# Patient Record
Sex: Female | Born: 1985 | Race: Black or African American | Hispanic: No | Smoking: Current every day smoker
Health system: Southern US, Community
[De-identification: ages and names within clinical notes are randomized; demographics above are authoritative.]

## PROBLEM LIST (undated history)

## (undated) DIAGNOSIS — E049 Nontoxic goiter, unspecified: Secondary | ICD-10-CM

## (undated) DIAGNOSIS — A539 Syphilis, unspecified: Secondary | ICD-10-CM

## (undated) DIAGNOSIS — F329 Major depressive disorder, single episode, unspecified: Secondary | ICD-10-CM

## (undated) DIAGNOSIS — F419 Anxiety disorder, unspecified: Secondary | ICD-10-CM

## (undated) DIAGNOSIS — F319 Bipolar disorder, unspecified: Secondary | ICD-10-CM

## (undated) DIAGNOSIS — R0600 Dyspnea, unspecified: Secondary | ICD-10-CM

## (undated) DIAGNOSIS — Z8742 Personal history of other diseases of the female genital tract: Secondary | ICD-10-CM

## (undated) DIAGNOSIS — R87629 Unspecified abnormal cytological findings in specimens from vagina: Secondary | ICD-10-CM

## (undated) HISTORY — PX: GANGLION CYST EXCISION: SHX1691

## (undated) HISTORY — DX: Unspecified abnormal cytological findings in specimens from vagina: R87.629

## (undated) HISTORY — DX: Personal history of other diseases of the female genital tract: Z87.42

## (undated) HISTORY — DX: Syphilis, unspecified: A53.9

---
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
## 2007-01-08 LAB — OB RESULTS CONSOLE RUBELLA ANTIBODY, IGM: Rubella: IMMUNE

## 2007-01-08 LAB — OB RESULTS CONSOLE VARICELLA ZOSTER ANTIBODY, IGG: Varicella: IMMUNE

## 2013-01-09 ENCOUNTER — Emergency Department: Payer: Self-pay | Admitting: Emergency Medicine

## 2013-07-17 DIAGNOSIS — L680 Hirsutism: Secondary | ICD-10-CM | POA: Insufficient documentation

## 2013-07-17 DIAGNOSIS — N911 Secondary amenorrhea: Secondary | ICD-10-CM | POA: Insufficient documentation

## 2013-07-22 DIAGNOSIS — L720 Epidermal cyst: Secondary | ICD-10-CM | POA: Insufficient documentation

## 2014-04-16 ENCOUNTER — Emergency Department: Payer: Self-pay | Admitting: Internal Medicine

## 2014-05-09 ENCOUNTER — Emergency Department: Payer: Self-pay | Admitting: Emergency Medicine

## 2014-05-09 LAB — CBC
HCT: 45.5 % (ref 35.0–47.0)
HGB: 14.8 g/dL (ref 12.0–16.0)
MCH: 31.1 pg (ref 26.0–34.0)
MCHC: 32.4 g/dL (ref 32.0–36.0)
MCV: 96 fL (ref 80–100)
Platelet: 266 10*3/uL (ref 150–440)
RBC: 4.74 10*6/uL (ref 3.80–5.20)
RDW: 13.2 % (ref 11.5–14.5)
WBC: 6.9 10*3/uL (ref 3.6–11.0)

## 2014-05-09 LAB — COMPREHENSIVE METABOLIC PANEL
ALK PHOS: 82 U/L
ALT: 39 U/L (ref 12–78)
Albumin: 4.5 g/dL (ref 3.4–5.0)
Anion Gap: 4 — ABNORMAL LOW (ref 7–16)
BUN: 5 mg/dL — AB (ref 7–18)
Bilirubin,Total: 0.5 mg/dL (ref 0.2–1.0)
Calcium, Total: 9.6 mg/dL (ref 8.5–10.1)
Chloride: 106 mmol/L (ref 98–107)
Co2: 28 mmol/L (ref 21–32)
Creatinine: 0.98 mg/dL (ref 0.60–1.30)
EGFR (African American): >60
EGFR (Non-African Amer.): >60
Glucose: 105 mg/dL — ABNORMAL HIGH (ref 65–99)
Osmolality: 273 (ref 275–301)
POTASSIUM: 4.1 mmol/L (ref 3.5–5.1)
SGOT(AST): 33 U/L (ref 15–37)
Sodium: 138 mmol/L (ref 136–145)
Total Protein: 8.6 g/dL — ABNORMAL HIGH (ref 6.4–8.2)

## 2014-05-09 LAB — URINALYSIS, COMPLETE
BILIRUBIN, UR: NEGATIVE
Bacteria: NONE SEEN
Blood: NEGATIVE
Glucose,UR: NEGATIVE mg/dL (ref 0–75)
KETONE: NEGATIVE
LEUKOCYTE ESTERASE: NEGATIVE
NITRITE: NEGATIVE
PH: 6 (ref 4.5–8.0)
Protein: NEGATIVE
RBC,UR: <1 /HPF (ref 0–5)
Specific Gravity: 1.008 (ref 1.003–1.030)
Squamous Epithelial: 3
WBC UR: 2 /HPF (ref 0–5)

## 2014-05-09 LAB — SALICYLATE LEVEL: SALICYLATES, SERUM: 3.9 mg/dL — AB

## 2014-05-09 LAB — ETHANOL
Ethanol %: <0.003 % (ref 0.000–0.080)
Ethanol: <3 mg/dL

## 2014-05-09 LAB — DRUG SCREEN, URINE
Amphetamines, Ur Screen: NEGATIVE (ref ?–1000)
Barbiturates, Ur Screen: NEGATIVE (ref ?–200)
Benzodiazepine, Ur Scrn: NEGATIVE (ref ?–200)
Cannabinoid 50 Ng, Ur ~~LOC~~: POSITIVE (ref ?–50)
Cocaine Metabolite,Ur ~~LOC~~: NEGATIVE (ref ?–300)
MDMA (Ecstasy)Ur Screen: NEGATIVE (ref ?–500)
METHADONE, UR SCREEN: NEGATIVE (ref ?–300)
OPIATE, UR SCREEN: NEGATIVE (ref ?–300)
Phencyclidine (PCP) Ur S: NEGATIVE (ref ?–25)
Tricyclic, Ur Screen: NEGATIVE (ref ?–1000)

## 2014-05-09 LAB — ACETAMINOPHEN LEVEL

## 2014-05-14 ENCOUNTER — Emergency Department: Payer: Self-pay | Admitting: Internal Medicine

## 2014-05-14 LAB — URINALYSIS, COMPLETE
BILIRUBIN, UR: NEGATIVE
BLOOD: NEGATIVE
Bacteria: NONE SEEN
Glucose,UR: NEGATIVE mg/dL (ref 0–75)
Ketone: NEGATIVE
NITRITE: NEGATIVE
Ph: 6 (ref 4.5–8.0)
Protein: NEGATIVE
SPECIFIC GRAVITY: 1.01 (ref 1.003–1.030)

## 2014-05-14 LAB — DRUG SCREEN, URINE
Amphetamines, Ur Screen: NEGATIVE (ref ?–1000)
Barbiturates, Ur Screen: NEGATIVE (ref ?–200)
Benzodiazepine, Ur Scrn: NEGATIVE (ref ?–200)
Cannabinoid 50 Ng, Ur ~~LOC~~: POSITIVE (ref ?–50)
Cocaine Metabolite,Ur ~~LOC~~: NEGATIVE (ref ?–300)
MDMA (Ecstasy)Ur Screen: NEGATIVE (ref ?–500)
Methadone, Ur Screen: NEGATIVE (ref ?–300)
OPIATE, UR SCREEN: NEGATIVE (ref ?–300)
PHENCYCLIDINE (PCP) UR S: NEGATIVE (ref ?–25)
TRICYCLIC, UR SCREEN: NEGATIVE (ref ?–1000)

## 2014-05-14 LAB — COMPREHENSIVE METABOLIC PANEL
ALBUMIN: 4.2 g/dL (ref 3.4–5.0)
ALK PHOS: 74 U/L
ANION GAP: 8 (ref 7–16)
BUN: 8 mg/dL (ref 7–18)
Bilirubin,Total: 0.3 mg/dL (ref 0.2–1.0)
CALCIUM: 8.9 mg/dL (ref 8.5–10.1)
CO2: 27 mmol/L (ref 21–32)
Chloride: 102 mmol/L (ref 98–107)
Creatinine: 0.77 mg/dL (ref 0.60–1.30)
EGFR (African American): >60
EGFR (Non-African Amer.): >60
GLUCOSE: 97 mg/dL (ref 65–99)
Osmolality: 272 (ref 275–301)
POTASSIUM: 3.6 mmol/L (ref 3.5–5.1)
SGOT(AST): 29 U/L (ref 15–37)
SGPT (ALT): 34 U/L (ref 12–78)
Sodium: 137 mmol/L (ref 136–145)
TOTAL PROTEIN: 7.8 g/dL (ref 6.4–8.2)

## 2014-05-14 LAB — CBC
HCT: 42.6 % (ref 35.0–47.0)
HGB: 14.5 g/dL (ref 12.0–16.0)
MCH: 32.2 pg (ref 26.0–34.0)
MCHC: 34 g/dL (ref 32.0–36.0)
MCV: 95 fL (ref 80–100)
Platelet: 252 10*3/uL (ref 150–440)
RBC: 4.5 10*6/uL (ref 3.80–5.20)
RDW: 12.8 % (ref 11.5–14.5)
WBC: 6.8 10*3/uL (ref 3.6–11.0)

## 2014-05-14 LAB — ACETAMINOPHEN LEVEL: Acetaminophen: <2 ug/mL

## 2014-05-14 LAB — ETHANOL
Ethanol %: <0.003 % (ref 0.000–0.080)
Ethanol: <3 mg/dL

## 2014-05-14 LAB — SALICYLATE LEVEL: SALICYLATES, SERUM: 3.5 mg/dL — AB

## 2014-05-22 ENCOUNTER — Emergency Department: Payer: Self-pay | Admitting: Emergency Medicine

## 2014-05-22 LAB — CBC
HCT: 42.3 % (ref 35.0–47.0)
HGB: 14.4 g/dL (ref 12.0–16.0)
MCH: 32.3 pg (ref 26.0–34.0)
MCHC: 34.1 g/dL (ref 32.0–36.0)
MCV: 95 fL (ref 80–100)
Platelet: 238 10*3/uL (ref 150–440)
RBC: 4.47 10*6/uL (ref 3.80–5.20)
RDW: 12.9 % (ref 11.5–14.5)
WBC: 10.6 10*3/uL (ref 3.6–11.0)

## 2014-05-23 LAB — ETHANOL: Ethanol: <3 mg/dL

## 2014-05-23 LAB — URINALYSIS, COMPLETE
Bacteria: NONE SEEN
Bilirubin,UR: NEGATIVE
Blood: NEGATIVE
Glucose,UR: NEGATIVE mg/dL (ref 0–75)
Ketone: NEGATIVE
NITRITE: NEGATIVE
Ph: 6 (ref 4.5–8.0)
Protein: 25
RBC,UR: 3 /HPF (ref 0–5)
Specific Gravity: 1.02 (ref 1.003–1.030)
Squamous Epithelial: 11
WBC UR: 24 /HPF (ref 0–5)

## 2014-05-23 LAB — COMPREHENSIVE METABOLIC PANEL
ALBUMIN: 4.1 g/dL (ref 3.4–5.0)
ALK PHOS: 71 U/L
ALT: 34 U/L (ref 12–78)
ANION GAP: 4 — AB (ref 7–16)
BUN: 10 mg/dL (ref 7–18)
Bilirubin,Total: 0.4 mg/dL (ref 0.2–1.0)
CO2: 28 mmol/L (ref 21–32)
CREATININE: 0.86 mg/dL (ref 0.60–1.30)
Calcium, Total: 9.1 mg/dL (ref 8.5–10.1)
Chloride: 104 mmol/L (ref 98–107)
GLUCOSE: 106 mg/dL — AB (ref 65–99)
OSMOLALITY: 271 (ref 275–301)
POTASSIUM: 3.4 mmol/L — AB (ref 3.5–5.1)
SGOT(AST): 20 U/L (ref 15–37)
SODIUM: 136 mmol/L (ref 136–145)
Total Protein: 7.7 g/dL (ref 6.4–8.2)

## 2014-05-23 LAB — DRUG SCREEN, URINE
AMPHETAMINES, UR SCREEN: NEGATIVE (ref ?–1000)
BARBITURATES, UR SCREEN: NEGATIVE (ref ?–200)
Benzodiazepine, Ur Scrn: NEGATIVE (ref ?–200)
CANNABINOID 50 NG, UR ~~LOC~~: POSITIVE (ref ?–50)
Cocaine Metabolite,Ur ~~LOC~~: NEGATIVE (ref ?–300)
MDMA (Ecstasy)Ur Screen: NEGATIVE (ref ?–500)
Methadone, Ur Screen: NEGATIVE (ref ?–300)
Opiate, Ur Screen: NEGATIVE (ref ?–300)
Phencyclidine (PCP) Ur S: NEGATIVE (ref ?–25)
TRICYCLIC, UR SCREEN: NEGATIVE (ref ?–1000)

## 2014-05-23 LAB — SALICYLATE LEVEL: Salicylates, Serum: 3.4 mg/dL — ABNORMAL HIGH

## 2014-05-23 LAB — LITHIUM LEVEL: Lithium: 0.78 mmol/L

## 2014-05-23 LAB — ACETAMINOPHEN LEVEL: Acetaminophen: <2 ug/mL

## 2014-06-12 ENCOUNTER — Emergency Department: Payer: Self-pay | Admitting: Emergency Medicine

## 2014-06-12 LAB — LITHIUM LEVEL: Lithium: 0.56 mmol/L — ABNORMAL LOW

## 2014-06-20 ENCOUNTER — Emergency Department: Payer: Self-pay | Admitting: Emergency Medicine

## 2014-06-24 ENCOUNTER — Emergency Department: Payer: Self-pay | Admitting: Emergency Medicine

## 2014-06-29 LAB — URINALYSIS, COMPLETE
BACTERIA: NONE SEEN
BLOOD: NEGATIVE
Bilirubin,UR: NEGATIVE
Glucose,UR: NEGATIVE mg/dL (ref 0–75)
Ketone: NEGATIVE
Nitrite: NEGATIVE
Ph: 6 (ref 4.5–8.0)
Protein: 25
Specific Gravity: 1.005 (ref 1.003–1.030)
Squamous Epithelial: 8
WBC UR: 12 /HPF (ref 0–5)

## 2014-06-29 LAB — ETHANOL
ETHANOL %: 0.084 % — AB (ref 0.000–0.080)
Ethanol: 84 mg/dL

## 2014-06-29 LAB — CBC
HCT: 40.8 % (ref 35.0–47.0)
HGB: 13.8 g/dL (ref 12.0–16.0)
MCH: 32.3 pg (ref 26.0–34.0)
MCHC: 33.8 g/dL (ref 32.0–36.0)
MCV: 96 fL (ref 80–100)
PLATELETS: 293 10*3/uL (ref 150–440)
RBC: 4.27 10*6/uL (ref 3.80–5.20)
RDW: 12.9 % (ref 11.5–14.5)
WBC: 8.3 10*3/uL (ref 3.6–11.0)

## 2014-06-29 LAB — DRUG SCREEN, URINE
Amphetamines, Ur Screen: NEGATIVE (ref ?–1000)
BARBITURATES, UR SCREEN: NEGATIVE (ref ?–200)
Benzodiazepine, Ur Scrn: NEGATIVE (ref ?–200)
COCAINE METABOLITE, UR ~~LOC~~: POSITIVE (ref ?–300)
Cannabinoid 50 Ng, Ur ~~LOC~~: POSITIVE (ref ?–50)
MDMA (Ecstasy)Ur Screen: NEGATIVE (ref ?–500)
METHADONE, UR SCREEN: NEGATIVE (ref ?–300)
OPIATE, UR SCREEN: NEGATIVE (ref ?–300)
PHENCYCLIDINE (PCP) UR S: NEGATIVE (ref ?–25)
TRICYCLIC, UR SCREEN: NEGATIVE (ref ?–1000)

## 2014-06-29 LAB — COMPREHENSIVE METABOLIC PANEL
ALK PHOS: 74 U/L
Albumin: 4 g/dL (ref 3.4–5.0)
Anion Gap: 4 — ABNORMAL LOW (ref 7–16)
BUN: 3 mg/dL — AB (ref 7–18)
Bilirubin,Total: 0.2 mg/dL (ref 0.2–1.0)
CHLORIDE: 108 mmol/L — AB (ref 98–107)
CO2: 26 mmol/L (ref 21–32)
CREATININE: 0.78 mg/dL (ref 0.60–1.30)
Calcium, Total: 8.8 mg/dL (ref 8.5–10.1)
EGFR (African American): >60
EGFR (Non-African Amer.): >60
Glucose: 93 mg/dL (ref 65–99)
OSMOLALITY: 272 (ref 275–301)
Potassium: 3.6 mmol/L (ref 3.5–5.1)
SGOT(AST): 31 U/L (ref 15–37)
SGPT (ALT): 28 U/L
Sodium: 138 mmol/L (ref 136–145)
Total Protein: 8 g/dL (ref 6.4–8.2)

## 2014-06-29 LAB — SALICYLATE LEVEL: SALICYLATES, SERUM: 3.9 mg/dL — AB

## 2014-06-29 LAB — ACETAMINOPHEN LEVEL: Acetaminophen: <2 ug/mL

## 2014-06-30 LAB — PREGNANCY, URINE: Pregnancy Test, Urine: NEGATIVE m[IU]/mL

## 2014-07-01 ENCOUNTER — Inpatient Hospital Stay: Payer: Self-pay | Admitting: Psychiatry

## 2014-07-03 LAB — HEMOGLOBIN A1C: Hemoglobin A1C: 5 % (ref 4.2–6.3)

## 2014-07-03 LAB — TSH: Thyroid Stimulating Horm: 0.881 u[IU]/mL

## 2014-07-03 LAB — LIPID PANEL
CHOLESTEROL: 155 mg/dL (ref 0–200)
HDL Cholesterol: 40 mg/dL (ref 40–60)
Ldl Cholesterol, Calc: 100 mg/dL (ref 0–100)
Triglycerides: 75 mg/dL (ref 0–200)
VLDL CHOLESTEROL, CALC: 15 mg/dL (ref 5–40)

## 2014-07-03 LAB — LITHIUM LEVEL: Lithium: 0.55 mmol/L — ABNORMAL LOW

## 2014-07-04 LAB — LITHIUM LEVEL: Lithium: 0.76 mmol/L

## 2014-09-17 DIAGNOSIS — F419 Anxiety disorder, unspecified: Secondary | ICD-10-CM

## 2014-09-17 DIAGNOSIS — F32A Depression, unspecified: Secondary | ICD-10-CM

## 2014-09-17 HISTORY — DX: Anxiety disorder, unspecified: F41.9

## 2014-09-17 HISTORY — DX: Depression, unspecified: F32.A

## 2015-03-21 NOTE — Consult Note (Signed)
PATIENT NAME:  Misty Bradshaw, Misty Bradshaw MR#:  [id number] DATE OF BIRTH:  07-07-1986  DATE OF CONSULTATION:  05/15/2014  REFERRING PHYSICIAN:  Glennie Isle, MD CONSULTING PHYSICIAN:  Ardeen Fillers. Garnetta Buddy, MD  REASON FOR CONSULTATION: "My father said that I was getting aggressive."   HISTORY OF PRESENT ILLNESS: The patient is a 29 year old African American female who was recently discharged from the ER, presented again by her father. She reported that she got into an argument with her father as he was driving and the patient became aggressive and mad.  She reported that she suffers from depression and anxiety. Reported that she will say things about things in her life and then she cries a lot. The patient reported that she does not know why she was brought again to the hospital by her father. Reported that she has been having some issues with her father recently. The patient reported that when she was discharged last time from the hospital she was not given any medications and probably she needs some medication to control her anger and aggression.   During my interview, the patient was calm and she was sitting in the bed. She reported that she was discharged from the hospital emergency room and she was not given any medications. She reported that she went many places after her discharge. She went to her dad's home as well as to her grandma and then they sat down and ate food. She started feeling depressed and then the depression comes and goes. She feels upset and aggressive sometimes. When she becomes aggressive, it scares her father. She stated that she needs some medication to control her anger. Reported that she has never taken any medication for the same. She sometimes takes Percocet and ibuprofen and vitamins. The patient currently denied using any drugs or alcohol. She reported that she lives with her family members. She also smokes weed occasionally.   PAST PSYCHIATRIC HISTORY: The patient denied any history of  violence or trauma in the past. She denied any history of suicide or homicide in the past. Reported that she has never been admitted to an inpatient psychiatric hospital. She does not follow with a psychiatrist in the outpatient.   SUBSTANCE ABUSE HISTORY: The patient denied any use of drugs or alcohol in the past.   FAMILY HISTORY: The patient reported that there is no history of mental illness in her family, reported that I should know them and I should have read her chart before seeing her.   MEDICAL HISTORY: The patient denied any acute medical problems at this time.   ALLERGIES: No known drug allergies.   SOCIAL HISTORY: The patient currently lives with her father.   REVIEW OF SYSTEMS:  CONSTITUTIONAL: Denies any fever or chills. No weight changes.  EYES: No double or blurred vision.  RESPIRATORY: No shortness of breath or cough.  CARDIOVASCULAR: Denies any chest pain or orthopnea.  GASTROINTESTINAL: No abdominal pain, nausea, vomiting or diarrhea.  GENITOURINARY: No incontinence or frequency.  ENDOCRINE: No heat or cold intolerance.  LYMPHATIC: No anemia or easy bruising.  INTEGUMENTARY: No acne or rash.  MUSCULOSKELETAL: No muscle or joint pain.  NEUROLOGIC: No tingling or weakness.   VITAL SIGNS: Temperature 97.5, pulse 74, respirations 18, blood pressure 116/71.   LABORATORY DATA: Pregnancy test negative. Glucose 97, BUN 8, creatinine 0.77, sodium 137, potassium 3.6, chloride 102, bicarbonate 27. Anion gap 8. Osmolality 272. Calcium 8.9. Blood alcohol less than 3. Protein 7.8, albumin 4.2, bilirubin 0.3, alkaline phosphatase 74, AST  29, ALT 34. UDS positive for cannabinoids. WBC 6.8, RBC 4.5, hemoglobin 14.5, hematocrit 42.6, MCV 95, RDW 12.8.   MENTAL STATUS EXAMINATION: The patient is a moderately built female who appeared her normal age and is well-nourished. Muscle tone appears normal. Gait and station appear within normal limits. Speech was normal in tone and volume.  Thought process was somewhat circumstantial. No loose associations are noted. Thought content was nondelusional. Insight and judgment were normal. She was awake, alert and oriented to time, place and person. Recent and remote memory is intact. Attention span and concentration are normal. Her language and fund of knowledge appears normal. Mood is fine. Affect is congruent.   DIAGNOSTIC IMPRESSION: AXIS I: Bipolar disorder, not otherwise specified.  AXIS II: None reported.  AXIS III: None reported.   TREATMENT PLAN: I will start the patient on lithium carbonate 300 mg p.o. b.i.d. and will give her a prescription for the same. She will be advised to follow up with [facility] and information provided to the patient. I advised the patient to come back to the hospital if she noticed worsening of her symptoms and she demonstrated understanding. The patient agreed with the plan. She will be discharged from the hospital at this time, and I will release her from involuntary commitment as she does not meet the criteria. She demonstrated understanding. Thank you for allowing me to participate in the care of this patient.   ____________________________ Ardeen FillersUzma S. Garnetta BuddyFaheem, MD usf:sb D: 05/15/2014 13:46:48 ET T: 05/15/2014 16:41:01 ET JOB#: [id number]  cc: Ardeen FillersUzma S. Garnetta BuddyFaheem, MD, <Dictator> Rhunette CroftUZMA S FAHEEM MD ELECTRONICALLY SIGNED 05/20/2014 9:28

## 2015-03-21 NOTE — Consult Note (Signed)
PATIENT NAME:  Misty Bradshaw, Misty Bradshaw MR#:  [id number] DATE OF BIRTH:  1986/04/03  DATE OF CONSULTATION:  06/30/2014  CONSULTING PHYSICIAN:  Audery Amel, MD  IDENTIFYING INFORMATION AND REASON FOR CONSULTATION: A 29 year old woman who has a history of mood instability who was petitioned here by her father. Consultation for appropriate disposition.   HISTORY OF PRESENT ILLNESS: Information obtained from the patient and the chart. Commitment paperwork states that she has been erratic, hypersexual, labile, dangerous behavior. The patient tells me that this is all because she had used some drugs and gotten into an argument with her father and he had petitioned her. The patient tells a story about behavior yesterday in which she was hanging around with some friends, at 1 point used a little bit of cocaine, later on got into a public argument with her father resulted in him petitioning her. It is actually fairly similar to the situations that brought her in to the hospital before. She says that she has been compliant with her lithium. Denies that she uses drugs regularly, just that she used the cocaine yesterday. Drinks a little bit, but minimizes it. Denies that she has any hallucinations. Denies suicidal or homicidal ideation.   PAST PSYCHIATRIC HISTORY: Several trips to the Emergency Room in the last couple of months with very similar stories. Father has petitioned her several times with complaints of erratic behavior. On some previous admissions she has been discharged from the Emergency Room. Denies any history of suicide attempts denies any history of violence. Says that she is taking lithium 300 twice a day.   PAST MEDICAL HISTORY: No significant ongoing medical problems.   SOCIAL HISTORY: The patient lives with her grandmother. She has 1 child, but that child lives with the patient's mother. She is not currently working. It sounds like she has a somewhat enmeshed relationship with the father.    SUBSTANCE ABUSE HISTORY: The patient tends to minimize or avoid this, does admit that she used some cocaine yesterday.   FAMILY HISTORY: Positive for 1 aunt and 1 uncle with major mental illness.   CURRENT MEDICATIONS: Lithium 300 mg twice a day, also said that she had been taking some oxycodone recently, but apparently not in the last couple of days.   ALLERGIES: FLAGYL.   REVIEW OF SYSTEMS: The patient denies suicidal or homicidal ideation. Denies hallucinations. Says that she has some pain in her left elbow area. Denies any other significant medical or physical problems.   MENTAL STATUS EXAMINATION: Adequately groomed woman, looks her stated age, cooperative with the interview. Eye contact good. Psychomotor activity fidgety and agitated, paces back and forth during the interview, lots of histrionic demonstrativeness. Voice is loud at times. Stories and thoughts are rambling and somewhat disconnected but not bizarre. No obviously delusional or psychotic statements. Denies hallucinations. Denies any suicidal or homicidal ideation. She is alert and oriented to her situation and time and place. Short and long-term memory appears to be generally intact.   LABORATORY RESULTS: Drug screen positive for cocaine and cannabis. Urinalysis 1+ leukocyte esterase, 12 white blood cells, CBC normal. Chemistry panel: Nothing particularly remarkable. Had an alcohol level of 84 when she came in. Pregnancy test negative.   VITAL SIGNS: Blood pressure is 117/58, respirations 18, pulse 58, temperature 97.8.   ASSESSMENT: A 29 year old woman with a history of bipolar disorder came into the hospital under involuntary commitment. Reports being agitated at home. Currently still somewhat agitated and euphoric and hyperactive in her thinking. Requires hospitalization for  treatment and stabilization.   DIAGNOSIS, PRINCIPAL AND PRIMARY:   AXIS I: Bipolar disorder type 1, hypomanic to manic.   SECONDARY DIAGNOSES:   AXIS I: Cocaine abuse.   AXIS II: Deferred.   AXIS III: No diagnosis.   AXIS IV: Moderate from this chronic stress with her father.   AXIS V: Functioning at time of evaluation is 40.    ____________________________ Audery AmelJohn T. Naomii Kreger, MD jtc:lt D: 06/30/2014 18:52:15 ET T: 06/30/2014 22:57:28 ET JOB#: [id number]  cc: Audery AmelJohn T. Joseluis Alessio, MD, <Dictator> Audery AmelJOHN T Kenslei Hearty MD ELECTRONICALLY SIGNED 07/24/2014 22:43
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
## 2015-03-21 NOTE — Consult Note (Signed)
PATIENT NAME:  Misty Bradshaw, Misty Bradshaw MR#:  [id number] DATE OF BIRTH:  Feb 03, 1986  DATE OF CONSULTATION:  05/10/2014  REFERRING PHYSICIAN:   CONSULTING PHYSICIAN:  Raphaela Cannaday K. Guss Bundehalla, MD  PLACE [facility] DICTATION:  [facility] - [facility] - [facility], [place]  SEX:  Female.  RACE:  African American.  AGE:  28 years.  SUBJECTIVE:  The patient was seen in consultation in [facility] Emergency Room [facility].  The patient is a 29 year old [place] female who is not employed, not married and according to information obtained, the patient was brought by her father for erratic behavior and walking in traffic and dangerous to self.  The patient  last night and he left [name] in the Emergency Room and left and the patient left AMA earlier.  The patient was seen calling from by [employer] and was calling 9-1-1.  When the patient was asked she reported that she was trying to get in touch with her dad to come pick her up from the [employer].  The patient was brought on IVC for violent, aggressive behavior.    CHIEF COMPLAINT:  "I came here to check out if I am pregnant or not."  ALCOHOL AND DRUGS:  Denied.  PAST PSYCHIATRIC HISTORY:  The patient denies any previous history of inpatient hold psychiatry.    OBJECTIVE:  She is alert and oriented to place, person and time.  She gets irritable and upset when questions are asked because she knew the day and date and where she is and the capital [place] and cognition is intact.  She denies feeling depressed.  Denies feeling hopeless or helpless.  Denies auditory or visual hallucinations.  Denies any paranoid or suspicious ideas.  Denies any statements and she said she just called 9-1-1 from [employer] as she was trying to get in touch with her father who is supposed to come and pick her up.  Insight and judgment guarded.   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   RECOMMEND:   Discontinue IVC and discharge the patient so father can come pick her up and take her with a followup appointment in the community where she will learn about her attitude and dealing with people around.    ____________________________ Jannet MantisSurya K. Guss Bundehalla, MD skc:ea D: 05/10/2014 18:48:36 ET T: 05/10/2014 23:16:15 ET JOB#: [id number]  cc: Monika SalkSurya K. Guss Bundehalla, MD, <Dictator> Beau FannySURYA K Wah Sabic MD ELECTRONICALLY SIGNED 05/11/2014 17:18

## 2015-03-21 NOTE — Consult Note (Signed)
Psychiatry: Consult for this 29 year old woman who was involuntarily petitioned to the emergency room.  Concern about dangerous behavior. of present illness: Information obtained from the patient and the chart.  Patient's father called the law and had her committed and brought to the hospital stating that the patient had been threatening to others.  The patient tells me that she and her father were having an argument.  She had initially been arguing with her grandmother and the argument then turned to involve her father.  She got angry and felt disrespected.  She denies however that she made any homicidal or suicidal statements.  She states that she left the scene of the argument and went for a walk to a local park.  It was there that the police came to pick her up.  Patient does admit that she continues to have some mood instability and gets angry frequently.  She denies any psychotic symptoms.  She has been compliant with the lithium that was started just a few days ago but has not gone for follow-up treatment. psychiatric history: She was in the emergency room here just a few days ago under similar circumstances.  The psychiatrist at that time started her on lithium.  Patient has been compliant with the medicine but has not gone for outpatient treatment.  Patient denies any previous psychiatric hospitalizations.  Denies any suicide attempts. history: No ongoing medical problems history: Has been living with her grandmother recently.  Not currently working not in school. abuse history: She denies any alcohol abuse or cocaine abuse.  Her  review of systems: Currently states her mood is feeling okay.  Denies suicidal or homicidal ideation.  Denies hallucinations.  No physical complaints. status exam: Adequately groomed woman looks her stated age cooperative with the interview eye contact intermittent psychomotor activity little fidgety.  Speech normal rate tone and volume.  Affect mildly dysphoric but not  hostile or aggressive.  Mood stated as being okay.  Thoughts appear to be generally lucid no evidence of loosening of associations or delusions.  Denies hallucinations.  Denies suicidal or homicidal ideation.  Judgment and insight adequate at the moment baseline intelligence normal short and long-term memory intact. This is a patient who presents is having some chronic mood instability.  She was petitioned here with allegations that she had been threatening to others.  Earlier in the afternoon she was hostile and angry with staff but did not do anything violent.  On my examination now she has calm down and does not currently show any signs of aggression.  She is not psychotic.  There is no indication at this point that she meets commitment criteria.  I do think she probably has some mood instability possibly personality disorder as well. plan: Patient is encouraged to continue the current lithium.  Her blood level today was 0.7 which is good.  She is strongly encouraged however to get follow-up mental health treatment as recommended previously with [facility].  Commitment is discontinued.  Case discussed with emergency room doctor.  She can be discharged from the emergency room. Bipolar disorder not otherwise specified, rule out personality disorder   Electronic Signatures: Audery Amellapacs, John T (MD)  (Signed on 27-Jun-15 01:23)  Authored  Last Updated: 27-Jun-15 01:23 by Audery Amellapacs, John T (MD)

## 2015-09-03 ENCOUNTER — Encounter: Payer: Self-pay | Admitting: *Deleted

## 2015-09-03 ENCOUNTER — Emergency Department
Admission: EM | Admit: 2015-09-03 | Discharge: 2015-09-04 | Disposition: A | Discharge status: Home / Self Care | Payer: Medicaid Other | Attending: Emergency Medicine | Admitting: Emergency Medicine

## 2015-09-03 DIAGNOSIS — F1210 Cannabis abuse, uncomplicated: Secondary | ICD-10-CM | POA: Insufficient documentation

## 2015-09-03 DIAGNOSIS — Z5900 Homelessness unspecified: Secondary | ICD-10-CM

## 2015-09-03 DIAGNOSIS — F310 Bipolar disorder, current episode hypomanic: Secondary | ICD-10-CM

## 2015-09-03 DIAGNOSIS — Z008 Encounter for other general examination: Secondary | ICD-10-CM | POA: Diagnosis present

## 2015-09-03 DIAGNOSIS — F22 Delusional disorders: Secondary | ICD-10-CM | POA: Diagnosis not present

## 2015-09-03 LAB — CBC
HEMATOCRIT: 47.3 % — AB (ref 35.0–47.0)
HEMOGLOBIN: 16 g/dL (ref 12.0–16.0)
MCH: 32.7 pg (ref 26.0–34.0)
MCHC: 33.8 g/dL (ref 32.0–36.0)
MCV: 96.9 fL (ref 80.0–100.0)
Platelets: 217 10*3/uL (ref 150–440)
RBC: 4.88 MIL/uL (ref 3.80–5.20)
RDW: 13.1 % (ref 11.5–14.5)
WBC: 9.9 10*3/uL (ref 3.6–11.0)

## 2015-09-03 LAB — COMPREHENSIVE METABOLIC PANEL
ALK PHOS: 63 U/L (ref 38–126)
ALT: 18 U/L (ref 14–54)
AST: 22 U/L (ref 15–41)
Albumin: 4.7 g/dL (ref 3.5–5.0)
Anion gap: 7 (ref 5–15)
BILIRUBIN TOTAL: 0.7 mg/dL (ref 0.3–1.2)
BUN: 5 mg/dL — AB (ref 6–20)
CALCIUM: 9.5 mg/dL (ref 8.9–10.3)
CO2: 26 mmol/L (ref 22–32)
CREATININE: 0.73 mg/dL (ref 0.44–1.00)
Chloride: 105 mmol/L (ref 101–111)
GFR calc Af Amer: >60 mL/min (ref >60)
GFR calc non Af Amer: >60 mL/min (ref >60)
Glucose, Bld: 94 mg/dL (ref 65–99)
Potassium: 3.4 mmol/L — ABNORMAL LOW (ref 3.5–5.1)
Sodium: 138 mmol/L (ref 135–145)
TOTAL PROTEIN: 8 g/dL (ref 6.5–8.1)

## 2015-09-03 LAB — ACETAMINOPHEN LEVEL: Acetaminophen (Tylenol), Serum: <10 ug/mL — ABNORMAL LOW (ref 10–30)

## 2015-09-03 LAB — LITHIUM LEVEL: LITHIUM LVL: 0.6 mmol/L (ref 0.60–1.20)

## 2015-09-03 LAB — SALICYLATE LEVEL

## 2015-09-03 LAB — ETHANOL: Alcohol, Ethyl (B): <5 mg/dL (ref <5)

## 2015-09-03 MED ORDER — LORAZEPAM 2 MG/ML IJ SOLN
1.0000 mg | Freq: Once | INTRAMUSCULAR | Status: DC
Start: 2015-09-03 — End: 2015-09-05
  Filled 2015-09-03: qty 1

## 2015-09-03 MED ORDER — HALOPERIDOL LACTATE 5 MG/ML IJ SOLN
5.0000 mg | Freq: Once | INTRAMUSCULAR | Status: DC
Start: 2015-09-03 — End: 2015-09-05
  Filled 2015-09-03: qty 1

## 2015-09-03 NOTE — ED Notes (Signed)
BEHAVIORAL HEALTH ROUNDING Patient sleeping: No. Patient alert and oriented: yes Behavior appropriate: Yes.   Nutrition and fluids offered: Yes  Toileting and hygiene offered: Yes  Sitter present: q15 min observations Law enforcement present: Yes [employer] 

## 2015-09-03 NOTE — ED Notes (Signed)
Pt reports taking trazodone and lithium but does not know specific dosage.

## 2015-09-03 NOTE — BH Assessment (Signed)
Assessment Note  Misty Bradshaw is an 29 y.o. female presenting to the ED for an evaluation because she thinks she may need her medications changed.  Pt was observed laughing and engaging in inappropriate conversations.  Pt became physically and verbally aggressive when RN tried to obtain blood sample.  Pt was uncooperative and refused to answer most questions.    Diagnosis: Psychiatric Evaluation  Past Medical History: No past medical history on file.  History reviewed. No pertinent past surgical history.  Family History: No family history on file.  Social History:  reports that she has never smoked. She does not have any smokeless tobacco history on file. She reports that she drinks alcohol. She reports that she does not use illicit drugs.  Additional Social History:  Alcohol / Drug Use History of alcohol / drug use?: No history of alcohol / drug abuse (Pt refuses to answer.)  CIWA: CIWA-Ar BP: 119/72 mmHg Pulse Rate: 74 COWS:    Allergies:  Allergies  Allergen Reactions  . Flagyl [Metronidazole] Hives    Home Medications:  (Not in a hospital admission)  OB/GYN Status:  No LMP recorded (lmp unknown).  General Assessment Data Location of Assessment: [facility] ED TTS Assessment: In system Is this a Tele or Face-to-Face Assessment?: Face-to-Face Is this an Initial Assessment or a Re-assessment for this encounter?: Initial Assessment Marital status: Single Maiden name: Hopf Is patient pregnant?: Unknown Pregnancy Status: Unknown Living Arrangements: Parent Can pt return to current living arrangement?: Yes Admission Status: Involuntary Is patient capable of signing voluntary admission?: No Referral Source: Self/Family/Friend Insurance type: Media planner Exam [facility] Walk-in ONLY) Medical Exam completed: Yes  Crisis Care Plan Living Arrangements: Parent Name of Psychiatrist: unknown Name of Therapist: unknown  Education Status Is patient  currently in school?: No Current Grade: N/A Highest grade of school patient has completed: N/A Name of school: N/A Contact person: N/a  Risk to self with the past 6 months Suicidal Ideation: No Has patient been a risk to self within the past 6 months prior to admission? : No Suicidal Intent: No Has patient had any suicidal intent within the past 6 months prior to admission? : No Is patient at risk for suicide?: No Suicidal Plan?: No Has patient had any suicidal plan within the past 6 months prior to admission? : No Access to Means: No What has been your use of drugs/alcohol within the last 12 months?: N/A Previous Attempts/Gestures: No How many times?: 0 Other Self Harm Risks: N/A Triggers for Past Attempts: None known Intentional Self Injurious Behavior: None Family Suicide History: Unable to assess Recent stressful life event(s): Other (Comment) Persecutory voices/beliefs?: No Depression: No Substance abuse history and/or treatment for substance abuse?: No Suicide prevention information given to non-admitted patients: Not applicable  Risk to Others within the past 6 months Homicidal Ideation: No Does patient have any lifetime risk of violence toward others beyond the six months prior to admission? : No Thoughts of Harm to Others: No Current Homicidal Intent: No Current Homicidal Plan: No Access to Homicidal Means: No Identified Victim: N/A History of harm to others?: No Assessment of Violence: None Noted Violent Behavior Description: N/A Does patient have access to weapons?: No Criminal Charges Pending?: No Does patient have a court date: No Is patient on probation?: No  Psychosis Hallucinations: Auditory Delusions: Persecutory  Mental Status Report Appearance/Hygiene: Unremarkable Eye Contact: Fair Motor Activity: Agitation Speech: Argumentative Level of Consciousness: Combative Mood: Angry, Sullen Affect: Angry Anxiety Level: Minimal Thought  Processes:  Flight of Ideas Judgement: Impaired Orientation: Person, Place Obsessive Compulsive Thoughts/Behaviors: None  Cognitive Functioning Concentration: Fair Memory: Recent Intact IQ: Average Insight: Poor Impulse Control: Poor Appetite: Fair Weight Loss: 0 Weight Gain: 0 Sleep: Unable to Assess Vegetative Symptoms: None  ADLScreening [facility] Assessment Services) Patient's cognitive ability adequate to safely complete daily activities?: Yes Patient able to express need for assistance with ADLs?: Yes Independently performs ADLs?: Yes (appropriate for developmental age)  Prior Inpatient Therapy Prior Inpatient Therapy: Yes Prior Therapy Dates: N/A Prior Therapy Facilty/Provider(s): N/A Reason for Treatment: N/a  Prior Outpatient Therapy Prior Outpatient Therapy: Yes Prior Therapy Dates: N/A Prior Therapy Facilty/Provider(s): N/a Reason for Treatment: N/A Does patient have an ACCT team?: Unknown Does patient have Intensive In-House Services?  : No Does patient have [facility] services? : Unknown Does patient have P4CC services?: Unknown  ADL Screening (condition at time of admission) Patient's cognitive ability adequate to safely complete daily activities?: Yes Patient able to express need for assistance with ADLs?: Yes Independently performs ADLs?: Yes (appropriate for developmental age)       Abuse/Neglect Assessment (Assessment to be complete while patient is alone) Physical Abuse: Denies Verbal Abuse: Denies Sexual Abuse: Denies Exploitation of patient/patient's resources: Denies Self-Neglect: Denies Values / Beliefs Cultural Requests During Hospitalization: None Spiritual Requests During Hospitalization: None Consults Spiritual Care Consult Needed: No Social Work Consult Needed: No Merchant navy officer (For Healthcare) Does patient have an advance directive?: No Would patient like information on creating an advanced directive?: Yes English as a second language teacher given     Additional Information 1:1 In Past 12 Months?: No CIRT Risk: No Elopement Risk: Yes     Disposition:  Disposition Initial Assessment Completed for this Encounter: Yes Disposition of Patient: Other dispositions Other disposition(s): Other (Comment) (Psych MD consult)  On Site Evaluation by:   Reviewed with Physician:    Artist [place] 09/03/2015 9:50 PM

## 2015-09-03 NOTE — ED Notes (Signed)
Pt moved to room 22 from bed 19H. Pt resting on stretcher in room with TV on/ Pt quiet and cooperative at this time, no abnormal behavior noted at this time. Will continue to monitor. ODS officer in area.

## 2015-09-03 NOTE — ED Notes (Addendum)
Attempting to administer medication to patient, patient became agitated, shouting at staff "I don't know you, back off...where is the doctor, i don't see him..I'm hungry." Spoke with MD, Dr. Darnelle Catalan states to hold medication at this time.

## 2015-09-03 NOTE — ED Notes (Signed)
Misty Bradshaw [phone] Father left contact information and requests that when pt is discharged that he be called because she lives with him and he will be her ride when she is discharged.  Father reports that when pt was [place] that [place] and [place] a vehicle with people and was driven to [facility]. Father had to drive down to pick pt up.

## 2015-09-03 NOTE — ED Notes (Signed)
Pt says that she thinks she just needs evaluation because she thinks she may need her meds changed. Pt laughing and engaging in inappropriate conversation.

## 2015-09-03 NOTE — ED Notes (Signed)
Spoke with pt's father who stated that the pt has been committed under IVC at this hop[sital multiple times. Per pt pt has been "erratic, belligerent, talking to people that are not there". Father reports that pt was staying with mother, of which father is not sure if pt takes medications correctly at [facility] house. Father has been through this before in that "when she doesn't not take her medicines she ends up coming to my house and I have to fix things"
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
## 2015-09-03 NOTE — ED Notes (Signed)
Pt in room. No complaints or concerns voiced at this time. No abnormal behavior noted at this time. Will continue to monitor with q15 min checks. ODS officer in area. 
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
## 2015-09-04 DIAGNOSIS — Z5900 Homelessness unspecified: Secondary | ICD-10-CM

## 2015-09-04 DIAGNOSIS — F1210 Cannabis abuse, uncomplicated: Secondary | ICD-10-CM

## 2015-09-04 DIAGNOSIS — F310 Bipolar disorder, current episode hypomanic: Secondary | ICD-10-CM

## 2015-09-04 LAB — URINE DRUG SCREEN, QUALITATIVE (ARMC ONLY)
Amphetamines, Ur Screen: NOT DETECTED
BARBITURATES, UR SCREEN: NOT DETECTED
Benzodiazepine, Ur Scrn: NOT DETECTED
CANNABINOID 50 NG, UR ~~LOC~~: POSITIVE — AB
COCAINE METABOLITE, UR ~~LOC~~: NOT DETECTED
MDMA (ECSTASY) UR SCREEN: NOT DETECTED
Methadone Scn, Ur: NOT DETECTED
OPIATE, UR SCREEN: NOT DETECTED
PHENCYCLIDINE (PCP) UR S: NOT DETECTED
Tricyclic, Ur Screen: NOT DETECTED

## 2015-09-04 MED ORDER — LORAZEPAM 2 MG/ML IJ SOLN
INTRAMUSCULAR | Status: AC
  Administered 2015-09-04: 1 mg via INTRAMUSCULAR
  Filled 2015-09-04: qty 1

## 2015-09-04 MED ORDER — LORAZEPAM 2 MG PO TABS
2.0000 mg | ORAL_TABLET | Freq: Once | ORAL | Status: AC
  Administered 2015-09-04: 2 mg via ORAL

## 2015-09-04 MED ORDER — QUETIAPINE FUMARATE 200 MG PO TABS: 200.0000 mg | ORAL_TABLET | Freq: Every day | ORAL | Status: DC

## 2015-09-04 MED ORDER — HALOPERIDOL LACTATE 5 MG/ML IJ SOLN
INTRAMUSCULAR | Status: AC
  Administered 2015-09-04: 5 mg via INTRAMUSCULAR
  Filled 2015-09-04: qty 1

## 2015-09-04 MED ORDER — HALOPERIDOL LACTATE 5 MG/ML IJ SOLN
5.0000 mg | Freq: Once | INTRAMUSCULAR | Status: AC
  Administered 2015-09-04: 5 mg via INTRAMUSCULAR

## 2015-09-04 MED ORDER — LORAZEPAM 2 MG/ML IJ SOLN
1.0000 mg | Freq: Once | INTRAMUSCULAR | Status: AC
  Administered 2015-09-04: 1 mg via INTRAMUSCULAR

## 2015-09-04 MED ORDER — LORAZEPAM 2 MG PO TABS
ORAL_TABLET | ORAL | Status: AC
  Administered 2015-09-04: 2 mg via ORAL
  Filled 2015-09-04: qty 1

## 2015-09-04 MED ORDER — DIPHENHYDRAMINE HCL 50 MG/ML IJ SOLN
INTRAMUSCULAR | Status: AC
  Administered 2015-09-04: 50 mg via INTRAMUSCULAR
  Filled 2015-09-04: qty 1

## 2015-09-04 MED ORDER — DIPHENHYDRAMINE HCL 50 MG/ML IJ SOLN
50.0000 mg | Freq: Once | INTRAMUSCULAR | Status: AC
  Administered 2015-09-04: 50 mg via INTRAMUSCULAR

## 2015-09-04 MED ORDER — QUETIAPINE FUMARATE 200 MG PO TABS
200.0000 mg | ORAL_TABLET | Freq: Every day | ORAL | Status: DC
  Administered 2015-09-04: 200 mg via ORAL
  Filled 2015-09-04: qty 1

## 2015-09-04 NOTE — ED Provider Notes (Signed)
-----------------------------------------   9:46 PM on 09/04/2015 -----------------------------------------  Patient has been seen and evaluated by psychiatry, and they believe the patient is safe for discharge home. She has follow-up at [facility]. We'll discharge home  Minna Antis, MD 09/04/15 2146

## 2015-09-04 NOTE — ED Notes (Signed)
Patient asleep in room. No noted distress or abnormal behavior. Will continue 15 minute checks and observation by security cameras for safety. 
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
## 2015-09-04 NOTE — ED Notes (Signed)
Pt. Noted sleeping in room. No complaints or concerns voiced. No distress or abnormal behavior noted. Will continue to monitor with security cameras. Q 15 minute rounds continue. 

## 2015-09-04 NOTE — ED Notes (Signed)
Pt. Going home with father. 

## 2015-09-04 NOTE — ED Notes (Signed)

## 2015-09-04 NOTE — Consult Note (Signed)
[facility] Face-to-Face Psychiatry Consult   Reason for Consult:  Consult for this 29 year old woman with a history of bipolar disorder who presented to the hospital agitated and confused last night Referring Physician:  Cinda Quest Patient Identification: Misty Bradshaw MRN:  [id number] Principal Diagnosis: Bipolar disorder, current episode hypomanic [facility]) Diagnosis:   Patient Active Problem List   Diagnosis Date Noted  . Bipolar disorder, current episode hypomanic ([place]) [F31.0] 09/04/2015  . Cannabis abuse [F12.10] 09/04/2015  . Homelessness [Z59.0] 09/04/2015    Total Time spent with patient: 1 hour  Subjective:   Misty Bradshaw is a 29 y.o. female patient admitted with "I just wanted to get evaluated".  HPI:  Information from the patient and the chart. Patient was interviewed. Chart was reviewed. Old notes evaluated. Case discussed with psychiatry staff in the emergency room. Labs and vital signs evaluated. Patient's came to the emergency room last night voluntarily stating she wanted to get evaluated. Apparently at the time she came in last night she was more agitated and seemed to be confused. Things escalated between her and staff and eventually she became very hostile. She was given some medicine has rested throughout the day today. Patient tells me that yesterday she was frustrated because she is having trouble getting along with her father. She's been staying with her father for about 4 days and during that time they have been arguing constantly. She says that her mood stays irritable much of the time. She says she is been sleeping poorly. She denies auditory or visual hallucinations. She denies any suicidal thoughts. Denies homicidal thoughts. She has not been compliant with medication. She had a prescription for lithium but was not taking it regularly at all. She did take it just before coming into the hospital however. She denied to me that she is been abusing any drugs although her drug screen  is positive for cannabis. She says she drinks about a 40 day can remember how long ago the last one was. Not compliant currently with recommended outpatient treatment.  Past psychiatric treatment is for multiple emergency room visits and at least one hospitalization in the past with a diagnosis of bipolar disorder. No history of suicide attempts. We'll get loud and agitated but it doesn't seem that she's been dangerously violent. She has been prescribed lithium in the past but has been only partially compliant. She can't remember any other medicine she was prescribed in the past.  Social history: Patient has been living with her father for the last few days. She has multiple members of her extended family in the area that she says she can stay with at times. She has 1 adolescent daughter but the daughter is not living with her. 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  Medical history: Knows of no significant ongoing medical problems.  Family history: Patient denies there is any family history of mental illness or substance abuse. Her [place] substance abuse history: Documented history of alcohol and drug abuse including at times cannabis and cocaine.  Past Psychiatric History: Patient has a past history of multiple visits to the emergency room but only 1 known hospitalization about a year ago. No history of suicide attempts. Gets agitated but not known to be dangerously violent. Has responded partially to lithium but tends to be noncompliant. History of substance abuse problems.  Risk to Self: Suicidal Ideation: No Suicidal Intent: No Is patient at risk for suicide?: No Suicidal Plan?: No Access to Means: No What has been  your use of drugs/alcohol within the last 12 months?: N/A How many times?: 0 Other Self Harm Risks: N/A Triggers for Past Attempts: None known Intentional Self Injurious Behavior: None Risk to Others: Homicidal Ideation: No Thoughts of Harm to  Others: No Current Homicidal Intent: No Current Homicidal Plan: No Access to Homicidal Means: No Identified Victim: N/A History of harm to others?: No Assessment of Violence: None Noted Violent Behavior Description: N/A Does patient have access to weapons?: No Criminal Charges Pending?: No Does patient have a court date: No Prior Inpatient Therapy: Prior Inpatient Therapy: Yes Prior Therapy Dates: N/A Prior Therapy Facilty/Provider(s): N/A Reason for Treatment: N/a Prior Outpatient Therapy: Prior Outpatient Therapy: Yes Prior Therapy Dates: N/A Prior Therapy Facilty/Provider(s): N/a Reason for Treatment: N/A Does patient have an ACCT team?: Unknown Does patient have Intensive In-House Services?  : No Does patient have [facility] services? : Unknown Does patient have P4CC services?: Unknown  Past Medical History: No past medical history on file. History reviewed. No pertinent past surgical history. Family History: No family history on file. Family Psychiatric  History: Patient denies that there is any family history of mental illness or substance abuse problems. Social History:  History  Alcohol Use  . Yes     History  Drug Use No    Social History   Social History  . Marital Status: Married    Spouse Name: N/A  . Number of Children: N/A  . Years of Education: N/A   Social History Main Topics  . Smoking status: Never Smoker   . Smokeless tobacco: None  . Alcohol Use: Yes  . Drug Use: No  . Sexual Activity: Not Asked   Other Topics Concern  . None   Social History Narrative  . None   Additional Social History:    History of alcohol / drug use?: No history of alcohol / drug abuse (Pt refuses to answer.)                     Allergies:   Allergies  Allergen Reactions  . Flagyl [Metronidazole] Hives    Labs:  Results for orders placed or performed during the hospital encounter of 09/03/15 (from the past 48 hour(s))  Comprehensive metabolic panel      Status: Abnormal   Collection Time: 09/03/15  7:45 PM  Result Value Ref Range   Sodium 138 135 - 145 mmol/L   Potassium 3.4 (L) 3.5 - 5.1 mmol/L   Chloride 105 101 - 111 mmol/L   CO2 26 22 - 32 mmol/L   Glucose, Bld 94 65 - 99 mg/dL   BUN 5 (L) 6 - 20 mg/dL   Creatinine, Ser 0.73 0.44 - 1.00 mg/dL   Calcium 9.5 8.9 - 10.3 mg/dL   Total Protein 8.0 6.5 - 8.1 g/dL   Albumin 4.7 3.5 - 5.0 g/dL   AST 22 15 - 41 U/L   ALT 18 14 - 54 U/L   Alkaline Phosphatase 63 38 - 126 U/L   Total Bilirubin 0.7 0.3 - 1.2 mg/dL   GFR calc non Af Amer >60 >60 mL/min   GFR calc Af Amer >60 >60 mL/min    Comment: (NOTE) The eGFR has been calculated using the CKD EPI equation. This calculation has not been validated in all clinical situations. eGFR's persistently <60 mL/min signify possible Chronic Kidney Disease.    Anion gap 7 5 - 15  Ethanol (ETOH)     Status: None   Collection Time:  09/03/15  7:45 PM  Result Value Ref Range   Alcohol, Ethyl (B) <5 <5 mg/dL    Comment:        LOWEST DETECTABLE LIMIT FOR SERUM ALCOHOL IS 5 mg/dL FOR MEDICAL PURPOSES ONLY   Salicylate level     Status: None   Collection Time: 09/03/15  7:45 PM  Result Value Ref Range   Salicylate Lvl <3.4 2.8 - 30.0 mg/dL  Acetaminophen level     Status: Abnormal   Collection Time: 09/03/15  7:45 PM  Result Value Ref Range   Acetaminophen (Tylenol), Serum <10 (L) 10 - 30 ug/mL    Comment:        THERAPEUTIC CONCENTRATIONS VARY SIGNIFICANTLY. A RANGE OF 10-30 ug/mL MAY BE AN EFFECTIVE CONCENTRATION FOR MANY PATIENTS. HOWEVER, SOME ARE BEST TREATED AT CONCENTRATIONS OUTSIDE THIS RANGE. ACETAMINOPHEN CONCENTRATIONS >150 ug/mL AT 4 HOURS AFTER INGESTION AND >50 ug/mL AT 12 HOURS AFTER INGESTION ARE OFTEN ASSOCIATED WITH TOXIC REACTIONS.   CBC     Status: Abnormal   Collection Time: 09/03/15  7:45 PM  Result Value Ref Range   WBC 9.9 3.6 - 11.0 K/uL   RBC 4.88 3.80 - 5.20 MIL/uL   Hemoglobin 16.0 12.0 - 16.0 g/dL    HCT 47.3 (H) 35.0 - 47.0 %   MCV 96.9 80.0 - 100.0 fL   MCH 32.7 26.0 - 34.0 pg   MCHC 33.8 32.0 - 36.0 g/dL   RDW 13.1 11.5 - 14.5 %   Platelets 217 150 - 440 K/uL  Lithium level     Status: None   Collection Time: 09/03/15  7:45 PM  Result Value Ref Range   Lithium Lvl 0.60 0.60 - 1.20 mmol/L  Urine Drug Screen, Qualitative ([facility] only)     Status: Abnormal   Collection Time: 09/04/15 12:27 AM  Result Value Ref Range   Tricyclic, Ur Screen NONE DETECTED NONE DETECTED   Amphetamines, Ur Screen NONE DETECTED NONE DETECTED   MDMA (Ecstasy)Ur Screen NONE DETECTED NONE DETECTED   Cocaine Metabolite,Ur [place] NONE DETECTED NONE DETECTED   Opiate, Ur Screen NONE DETECTED NONE DETECTED   Phencyclidine (PCP) Ur S NONE DETECTED NONE DETECTED   Cannabinoid 50 Ng, Ur [place] POSITIVE (A) NONE DETECTED   Barbiturates, Ur Screen NONE DETECTED NONE DETECTED   Benzodiazepine, Ur Scrn NONE DETECTED NONE DETECTED   Methadone Scn, Ur NONE DETECTED NONE DETECTED    Comment: (NOTE) 287  Tricyclics, urine               Cutoff 1000 ng/mL 200  Amphetamines, urine             Cutoff 1000 ng/mL 300  MDMA (Ecstasy), urine           Cutoff 500 ng/mL 400  Cocaine Metabolite, urine       Cutoff 300 ng/mL 500  Opiate, urine                   Cutoff 300 ng/mL 600  Phencyclidine (PCP), urine      Cutoff 25 ng/mL 700  Cannabinoid, urine              Cutoff 50 ng/mL 800  Barbiturates, urine             Cutoff 200 ng/mL 900  Benzodiazepine, urine           Cutoff 200 ng/mL 1000 Methadone, urine  Cutoff 300 ng/mL 1100 1200 The urine drug screen provides only a preliminary, unconfirmed 1300 analytical test result and should not be used for non-medical 1400 purposes. Clinical consideration and professional judgment should 1500 be applied to any positive drug screen result due to possible 1600 interfering substances. A more specific alternate chemical method 1700 must be used in order to obtain a confirmed  analytical result.  1800 Gas chromato graphy / mass spectrometry (GC/MS) is the preferred 1900 confirmatory method.     Current Facility-Administered Medications  Medication Dose Route Frequency Provider Last Rate Last Dose  . haloperidol lactate (HALDOL) injection 5 mg  5 mg Intramuscular Once Nena Polio, MD   0 mg at 09/04/15 0058  . LORazepam (ATIVAN) injection 1 mg  1 mg Intramuscular Once Nena Polio, MD   0 mg at 09/04/15 0059  . QUEtiapine (SEROQUEL) tablet 200 mg  200 mg Oral QHS Gonzella Lex, MD       Current Outpatient Prescriptions  Medication Sig Dispense Refill  . QUEtiapine (SEROQUEL) 200 MG tablet Take 1 tablet (200 mg total) by mouth at bedtime. 30 tablet 0    Musculoskeletal: Strength & Muscle Tone: within normal limits Gait & Station: normal Patient leans: N/A  Psychiatric Specialty Exam: Review of Systems  Constitutional: Negative.   HENT: Negative.   Eyes: Negative.   Respiratory: Negative.   Cardiovascular: Negative.   Gastrointestinal: Negative.   Musculoskeletal: Negative.   Skin: Negative.   Neurological: Negative.   Psychiatric/Behavioral: Negative for depression, suicidal ideas, hallucinations, memory loss and substance abuse. The patient has insomnia. The patient is not nervous/anxious.     Blood pressure 126/75, pulse 89, temperature 98 F (36.7 C), temperature source Oral, resp. rate 20, height '5\' 6"'  (1.676 m), weight 86.183 kg (190 lb), SpO2 100 %.Body mass index is 30.68 kg/(m^2).  General Appearance: Casual  Eye Contact::  Good  Speech:  Clear and Coherent  Volume:  Decreased  Mood:  Euthymic  Affect:  Congruent  Thought Process:  Logical  Orientation:  Full (Time, Place, and Person)  Thought Content:  Negative  Suicidal Thoughts:  No  Homicidal Thoughts:  No  Memory:  Immediate;   Good Recent;   Fair Remote;   Fair  Judgement:  Fair  Insight:  Fair  Psychomotor Activity:  Normal  Concentration:  Fair  Recall:  [employer] of Knowledge:Fair  Language: Fair  Akathisia:  No  Handed:  Right  AIMS (if indicated):     Assets:  Communication Skills Desire for Improvement Physical Health Resilience  ADL's:  Intact  Cognition: WNL  Sleep:      Treatment Plan Summary: Medication management and Plan As noted patient has been fully evaluated. Several problems identified. Acute worsening of hypomanic behavior as part of bipolar disorder. Has calm down at this point. Does not require inpatient hospitalization. She is agreeing to outpatient treatment. Patient has been educated about the illness and will be prescribed Seroquel 200 mg at night. I offered her a choice between that and lithium but she prefers to try something different from the lithium. Side effects discussed she agrees to plan. She will be referred to [place]. As far as her substance abuse she has been counseled about the dangers of the interaction of substance abuse with mood disorder and strongly encouraged to discontinue the use of all abuse of substances. As far as homelessness she discussed several options of places to stay and thinks that she will be  okay to go back and stay with her father at least temporarily. Case discussed with emergency room psychiatry staff. Patient is taken off commitment and can be discharged at the discretion of the emergency room physician.  Disposition: Patient does not meet criteria for psychiatric inpatient admission. Supportive therapy provided about ongoing stressors. Discussed crisis plan, support from social network, calling 911, coming to the Emergency Department, and calling Suicide Hotline.  Misty Bradshaw 09/04/2015 7:40 PM

## 2015-09-04 NOTE — ED Notes (Signed)
BEHAVIORAL HEALTH ROUNDING Patient sleeping: No. Patient alert and oriented: yes Behavior appropriate: Yes.  ; If no, describe:  Nutrition and fluids offered: Yes  Toileting and hygiene offered: Yes  Sitter present: no Law enforcement present: Yes  

## 2015-09-04 NOTE — ED Notes (Signed)
Report called to [facility]. PT to move to [facility] room 6 Pt accompanied by ED Roque Cash and [employer] Officer.

## 2015-09-04 NOTE — ED Notes (Signed)
Patient appears to be resting quietly but was noticed to get up for toileting. Does not appear to be in any distress. Maintained on 15 minute checks and observation by security camera for safety.

## 2015-09-04 NOTE — ED Notes (Signed)

## 2015-09-04 NOTE — ED Notes (Signed)
Patient remains angry, labile when talking with staff. She currently is resting in her room, in no apparent distress. Maintained on 15 minute checks and observation by security camera for safety.

## 2015-09-04 NOTE — ED Notes (Signed)
Meal given

## 2015-09-04 NOTE — ED Notes (Signed)
Patient was asked to put used towels into laundry bag and throw paper scrubs into trash. She immediately started cursing at the nurse and threw her stuff on the floor. She was redirected not to be abusive to staff. She did comply with request but remained angry and slammed door to her room. Will continue 15 minute checks and observation by security cameras for safety.

## 2015-09-04 NOTE — ED Notes (Signed)
Patient is awake. She is still refusing to pick up breakfast food that she threw earlier. Her lunch tray and a beverage were given to her. Patient remains very angry and irritable. Unwilling to accept responsibility for her earlier behaviors. Maintained on 15 minute checks and observation by security camera for safety.

## 2015-09-04 NOTE — ED Notes (Signed)
Dr. Cleda Daub in patient room.  Pt. Appears calm at this time.

## 2015-09-04 NOTE — ED Notes (Signed)
Pt. To BHU from ED ambulatory without difficulty, to room 6 . Report from [facility]. Pt. Is alert and oriented, warm and dry in no distress. Pt. Denies SI, HI, and AVH. Pt. Loud and uncooperative. Pt. Made aware of security cameras and Q15 minute rounds. Pt. Encouraged to let Nursing staff know of any concerns or needs.

## 2015-09-04 NOTE — ED Notes (Signed)
MD at bedside. 

## 2015-09-04 NOTE — ED Notes (Signed)
Patient noted to be resting quietly in bed in no apparent distress. Will continue on 15 minute checks and observation by security camera.

## 2015-09-04 NOTE — ED Notes (Signed)
BEHAVIORAL HEALTH ROUNDING Patient sleeping: Yes.   Patient alert and oriented: not applicable Behavior appropriate: Yes.    Nutrition and fluids offered: No Toileting and hygiene offered: No Sitter present: q15 minute observations Law enforcement present: Yes [employer] 

## 2015-09-04 NOTE — ED Provider Notes (Signed)
-----------------------------------------   11:58 AM on 09/04/2015 -----------------------------------------  The patient is acting out, throwing phones, throwing food, and acting very erratically. I have ordered additional Haldol, Benadryl, and Ativan for the patient due to severe agitation and risk of self injury.  Sharyn Creamer, MD 09/04/15 1159

## 2015-09-10 ENCOUNTER — Emergency Department
Admission: EM | Admit: 2015-09-10 | Discharge: 2015-09-11 | Disposition: A | Discharge status: Home / Self Care | Payer: Medicaid Other | Attending: Emergency Medicine | Admitting: Emergency Medicine

## 2015-09-10 ENCOUNTER — Encounter: Payer: Self-pay | Admitting: Emergency Medicine

## 2015-09-10 DIAGNOSIS — Z720 Tobacco use: Secondary | ICD-10-CM | POA: Diagnosis not present

## 2015-09-10 DIAGNOSIS — F1010 Alcohol abuse, uncomplicated: Secondary | ICD-10-CM

## 2015-09-10 DIAGNOSIS — Z3202 Encounter for pregnancy test, result negative: Secondary | ICD-10-CM | POA: Insufficient documentation

## 2015-09-10 DIAGNOSIS — F1994 Other psychoactive substance use, unspecified with psychoactive substance-induced mood disorder: Secondary | ICD-10-CM

## 2015-09-10 DIAGNOSIS — F1910 Other psychoactive substance abuse, uncomplicated: Secondary | ICD-10-CM

## 2015-09-10 DIAGNOSIS — F603 Borderline personality disorder: Secondary | ICD-10-CM | POA: Diagnosis not present

## 2015-09-10 DIAGNOSIS — F310 Bipolar disorder, current episode hypomanic: Secondary | ICD-10-CM | POA: Diagnosis present

## 2015-09-10 DIAGNOSIS — F1210 Cannabis abuse, uncomplicated: Secondary | ICD-10-CM | POA: Diagnosis present

## 2015-09-10 DIAGNOSIS — F911 Conduct disorder, childhood-onset type: Secondary | ICD-10-CM | POA: Diagnosis present

## 2015-09-10 HISTORY — DX: Bipolar disorder, unspecified: F31.9

## 2015-09-10 HISTORY — DX: Anxiety disorder, unspecified: F41.9

## 2015-09-10 HISTORY — DX: Major depressive disorder, single episode, unspecified: F32.9

## 2015-09-10 LAB — COMPREHENSIVE METABOLIC PANEL
ALBUMIN: 4.2 g/dL (ref 3.5–5.0)
ALT: 22 U/L (ref 14–54)
ANION GAP: 5 (ref 5–15)
AST: 22 U/L (ref 15–41)
Alkaline Phosphatase: 52 U/L (ref 38–126)
BILIRUBIN TOTAL: 0.5 mg/dL (ref 0.3–1.2)
BUN: 9 mg/dL (ref 6–20)
CALCIUM: 9 mg/dL (ref 8.9–10.3)
CO2: 27 mmol/L (ref 22–32)
Chloride: 106 mmol/L (ref 101–111)
Creatinine, Ser: 0.7 mg/dL (ref 0.44–1.00)
GFR calc non Af Amer: >60 mL/min (ref >60)
GLUCOSE: 103 mg/dL — AB (ref 65–99)
POTASSIUM: 3.8 mmol/L (ref 3.5–5.1)
SODIUM: 138 mmol/L (ref 135–145)
TOTAL PROTEIN: 7.3 g/dL (ref 6.5–8.1)

## 2015-09-10 LAB — CBC WITH DIFFERENTIAL/PLATELET
BASOS ABS: 0 10*3/uL (ref 0–0.1)
BASOS PCT: 1 %
EOS ABS: 0.2 10*3/uL (ref 0–0.7)
Eosinophils Relative: 3 %
HEMATOCRIT: 44.2 % (ref 35.0–47.0)
Hemoglobin: 14.7 g/dL (ref 12.0–16.0)
LYMPHS PCT: 31 %
Lymphs Abs: 1.9 10*3/uL (ref 1.0–3.6)
MCH: 32.3 pg (ref 26.0–34.0)
MCHC: 33.2 g/dL (ref 32.0–36.0)
MCV: 97.1 fL (ref 80.0–100.0)
MONO ABS: 0.5 10*3/uL (ref 0.2–0.9)
Monocytes Relative: 8 %
Neutro Abs: 3.7 10*3/uL (ref 1.4–6.5)
Neutrophils Relative %: 57 %
PLATELETS: 225 10*3/uL (ref 150–440)
RBC: 4.55 MIL/uL (ref 3.80–5.20)
RDW: 12.9 % (ref 11.5–14.5)
WBC: 6.3 10*3/uL (ref 3.6–11.0)

## 2015-09-10 LAB — URINALYSIS COMPLETE WITH MICROSCOPIC (ARMC ONLY)
BILIRUBIN URINE: NEGATIVE
GLUCOSE, UA: NEGATIVE mg/dL
NITRITE: NEGATIVE
Protein, ur: 100 mg/dL — AB
Specific Gravity, Urine: 1.019 (ref 1.005–1.030)
pH: 6 (ref 5.0–8.0)

## 2015-09-10 LAB — URINE DRUG SCREEN, QUALITATIVE (ARMC ONLY)
AMPHETAMINES, UR SCREEN: NOT DETECTED
Barbiturates, Ur Screen: NOT DETECTED
Benzodiazepine, Ur Scrn: NOT DETECTED
COCAINE METABOLITE, UR ~~LOC~~: NOT DETECTED
Cannabinoid 50 Ng, Ur ~~LOC~~: POSITIVE — AB
MDMA (ECSTASY) UR SCREEN: NOT DETECTED
METHADONE SCREEN, URINE: NOT DETECTED
Opiate, Ur Screen: NOT DETECTED
Phencyclidine (PCP) Ur S: NOT DETECTED
TRICYCLIC, UR SCREEN: NOT DETECTED

## 2015-09-10 LAB — PREGNANCY, URINE: PREG TEST UR: NEGATIVE

## 2015-09-10 LAB — ETHANOL: Alcohol, Ethyl (B): <5 mg/dL (ref <5)

## 2015-09-10 LAB — ACETAMINOPHEN LEVEL

## 2015-09-10 LAB — SALICYLATE LEVEL

## 2015-09-10 MED ORDER — ZIPRASIDONE MESYLATE 20 MG IM SOLR
INTRAMUSCULAR | Status: AC
Start: 2015-09-10 — End: 2015-09-10
  Filled 2015-09-10: qty 20

## 2015-09-10 MED ORDER — ZIPRASIDONE MESYLATE 20 MG IM SOLR
20.0000 mg | Freq: Once | INTRAMUSCULAR | Status: AC
  Administered 2015-09-10: 13:00:00 via INTRAMUSCULAR

## 2015-09-10 NOTE — BHH Counselor (Signed)
Writer unable to assessed at this time. Patient was giving IM medication due to aggressive behaviors.

## 2015-09-10 NOTE — ED Notes (Signed)

## 2015-09-10 NOTE — ED Notes (Signed)
BEHAVIORAL HEALTH ROUNDING Patient sleeping: Yes.   Patient alert and oriented: not applicable Behavior appropriate: Yes.  ; If no, describe:  Nutrition and fluids offered: Yes  Toileting and hygiene offered: Yes  Sitter present: yes Law enforcement present: Yes  

## 2015-09-10 NOTE — ED Notes (Signed)
Report given to Florentina AddisonKatie, Charity fundraiserN. [facility]
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
## 2015-09-10 NOTE — ED Notes (Signed)
Report received from Katie RN. Patient care assumed. Patient/RN introduction complete. Will continue to monitor.  

## 2015-09-10 NOTE — ED Notes (Signed)
Patient was given her mattress pad and pillow back at 1405. This was removed earlier by staff due to her being very confrontational and throwing contents into the hallway.

## 2015-09-10 NOTE — ED Notes (Addendum)
BEHAVIORAL HEALTH ROUNDING Patient sleeping: yes Patient alert and oriented: not applicable Behavior appropriate: Yes.  ; If no, describe:  Nutrition and fluids offered: Yes  Toileting and hygiene offered: Yes  Sitter present: yes Law enforcement present: Yes

## 2015-09-10 NOTE — ED Notes (Signed)

## 2015-09-10 NOTE — ED Notes (Signed)

## 2015-09-10 NOTE — ED Notes (Signed)

## 2015-09-10 NOTE — ED Notes (Signed)
BEHAVIORAL HEALTH ROUNDING Patient sleeping: No. Patient alert and oriented: yes Behavior appropriate: Yes.  ; If no, describe:  Nutrition and fluids offered: Yes  Toileting and hygiene offered: Yes  Sitter present: yes Law enforcement present: Yes  ENVIRONMENTAL ASSESSMENT Potentially harmful objects out of patient reach: Yes.   Personal belongings secured: Yes.   Patient dressed in hospital provided attire only: Yes.   Plastic bags out of patient reach: Yes.   Patient care equipment (cords, cables, call bells, lines, and drains) shortened, removed, or accounted for: Yes.   Equipment and supplies removed from bottom of stretcher: Yes.   Potentially toxic materials out of patient reach: Yes.   Sharps container removed or out of patient reach: Yes.    Patient assigned to appropriate care area. Patient oriented to unit/care area: Informed that, for their safety, care areas are designed for safety and monitored by security cameras at all times; and visiting hours explained to patient. Patient verbalizes understanding, and verbal contract for safety obtained.

## 2015-09-10 NOTE — ED Notes (Signed)
BEHAVIORAL HEALTH ROUNDING2 Patient sleeping: No. Patient alert and oriented: yes Behavior appropriate: Yes.  ; If no, describe:  Nutrition and fluids offered: Yes  Toileting and hygiene offered: Yes  Sitter present: yes Law enforcement present: Yes  ods

## 2015-09-10 NOTE — ED Notes (Signed)
Patient resting comfortably in room. No complaints or concerns voiced. No distress or abnormal behavior noted. Will continue to monitor with security cameras. Q 15 minute rounds continue. 
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
## 2015-09-10 NOTE — ED Notes (Addendum)
Attempted to check on patient, pt gave this RN the middle finger. Pt with even and nonlabored respirations, no distress noted at this time. Will continue to monitor.
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
## 2015-09-10 NOTE — ED Notes (Signed)
Pt states does not want food tray at this time

## 2015-09-10 NOTE — ED Notes (Signed)
Pt laying in bed at this time watching tv, pt calm and cooperative at this time with no signs of aggression.  Pt has no complaints, awaiting disposition, will continue to monitor.

## 2015-09-11 DIAGNOSIS — F1010 Alcohol abuse, uncomplicated: Secondary | ICD-10-CM

## 2015-09-11 DIAGNOSIS — F1994 Other psychoactive substance use, unspecified with psychoactive substance-induced mood disorder: Secondary | ICD-10-CM

## 2015-09-11 MED ORDER — ZIPRASIDONE MESYLATE 20 MG IM SOLR
INTRAMUSCULAR | Status: AC
  Administered 2015-09-11: 20 mg via INTRAMUSCULAR
  Filled 2015-09-11: qty 20

## 2015-09-11 MED ORDER — ZIPRASIDONE MESYLATE 20 MG IM SOLR
20.0000 mg | Freq: Once | INTRAMUSCULAR | Status: AC
  Administered 2015-09-11: 20 mg via INTRAMUSCULAR

## 2015-09-11 NOTE — ED Notes (Signed)
Patient resting comfortably in room. No complaints or concerns voiced. No distress or abnormal behavior noted. Will continue to monitor with security cameras. Q 15 minute rounds continue. 

## 2015-09-11 NOTE — ED Notes (Signed)

## 2015-09-11 NOTE — ED Notes (Signed)
Patient discharged ambulatory to care of family. She denies SI or HI. Discharge instructions reviewed with patient, she verbalizes understanding. Patient received copy of DC plan and all personal belongings.

## 2015-09-11 NOTE — ED Notes (Addendum)
Patient started to escalate both physically and verbally. She threw her food all over the floor and smeared ketchup on the walls. Started cursing and threatening to hurt staff.  Case discussed with Dr. Fanny BienQuale. Decision made to give Geodon IM to assist with behavioral control. Patient did take injection with show of force.  Patient requested to use phone but this was denied at this time due to patient behavior. She was told her father was in the waiting room. Visitation would also be dependent on patients behavior at the time of visiting hours. Patient verbalized understanding.

## 2015-09-11 NOTE — ED Provider Notes (Signed)
-----------------------------------------   7:21 AM on 09/11/2015 -----------------------------------------   Blood pressure 105/65, pulse 64, temperature 97.8 F (36.6 C), temperature source Oral, resp. rate 18, height 5\' 6"  (1.676 m), weight 190 lb (86.183 kg), last menstrual period 09/10/2015, SpO2 99 %.  The patient had no acute events since last update.  Calm and cooperative at this time.  Disposition is pending per Psychiatry/Behavioral Medicine team recommendations.     Irean HongJade J Sung, MD 09/11/15 [phone]
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
## 2015-09-11 NOTE — Discharge Instructions (Signed)
You have been seen in the Emergency Department (ED) today for a psychiatric complaint.  You have been evaluated by psychiatry and we believe you are safe to be discharged from the hospital.  You have an appointment Monday with [facility] at 9 AM, please make sure that you attend.     Please return to the ED immediately if you have ANY thoughts of hurting yourself or anyone else, so that we may help you.  Please avoid alcohol and drug use.  Follow up with your doctor and/or therapist as soon as possible regarding today's ED visit.   Please follow up any other recommendations and clinic appointments provided by the psychiatry team that saw you in the Emergency Department.   Borderline Personality Disorder Borderline personality disorder is a mental health disorder. People with borderline personality disorder have unhealthy patterns of perceiving, thinking about, and reacting to their environment and events in their life. These patterns are established by adolescence or early adulthood. People with borderline personality disorder also have difficulty coping with stress on their own and fear being abandoned by others. They have difficulty controlling their emotions. Their emotions change quickly, frequently, and intensely. They are easily upset and can become very angry, very suddenly. Their unpredictable behavior often leads to problems in their relationships. They often feel worthless, unloved, and emotionally empty. CAUSES No one knows the exact cause of borderline personality disorder. Most mental health experts think that there is more than one cause. Possible contributing factors include:  Genetic factors. These are traits that are passed down from one generation to the next. Many people with borderline personality disorder have a family history of the disorder.  Physical factors. The part of the brain that controls emotion may be different in people who have borderline personality disorder.  Social  factors. Traumatic experiences involving other people may play a role in the development of borderline personality disorder. Examples include neglect, abandonment, and physical and sexual abuse. SYMPTOMS Signs and symptoms of borderline personality disorder include:  A series of unstable personal relationships.  A strong fear of being abandoned and frantic efforts to avoid abandonment.  Impulsive, self-destructive behavior, such as substance abuse, irrational spending of money, unprotected sex with multiple partners, reckless driving, and binge eating.  Poor self-image that also may change a lot, or a sense of identity that is inconsistent.  Recurring self-injury or attempted suicide.  Severe mood swings, including depression, irritability, and anxiety.  Lasting feelings of emptiness.  Difficulty controlling anger.  Temporary feelings of paranoia or loss of touch with reality. DIAGNOSIS A diagnosis of borderline personality disorder requires the presence of at least 5 of the common signs and symptoms. This information is gathered from family and friends as well as medical professionals and legal professionals who have a close association with the patient. This information is also gathered during a psychiatric assessment. During the assessment, the patient is asked about early life experiences, level of education, employment status, physical health conditions, and current prescription and over-the-counter medicines used. TREATMENT Caregivers who usually treat borderline personality disorder are mental health professionals, such as psychologists, psychiatrists, and clinical social workers. More than one type of treatment may be needed. Types of treatment include:  Psychotherapy (also known as talk therapy or counseling).  Cognitive behavioral therapy. This helps the person to recognize and change unhealthy feelings, thoughts, and behaviors. They find new, more positive thoughts and actions  to replace the old ones.  Dialectical behavioral therapy. Through this type of treatment, a  person learns to understand his or her feelings and to regulate them. This may be one-on-one treatment or part of group therapy.  Family therapy. This treatment includes family members.  Medicine. Medicine may be used to help control emotions, reduce reckless and self-destructive behavior, treat anxiety, and treat depression. SEEK IMMEDIATE MEDICAL CARE IF:   You cannot control your behavior or emotions.  You think about hurting yourself.  You think about suicide. FOR MORE INFORMATION National Alliance on Mental Illness: www.nami.org U[employer] of Mental Health: http://www.maynard.net/www.nimh.nih.gov Borderline Personality Resource Center: http://bpdresourcecenter.org   This information is not intended to replace advice given to you by your health care provider. Make sure you discuss any questions you have with your health care provider.   Document Released: 03/01/2011 Document Revised: 02/06/2012 Document Reviewed: 03/01/2011 Elsevier Interactive Patient Education 2016 [employer]  Polysubstance Abuse When people abuse more than one drug or type of drug it is called polysubstance or polydrug abuse. For example, many smokers also drink alcohol. This is one form of polydrug abuse. Polydrug abuse also refers to the use of a drug to counteract an unpleasant effect produced by another drug. It may also be used to help with withdrawal from another drug. People who take stimulants may become agitated. Sometimes this agitation is countered with a tranquilizer. This helps protect against the unpleasant side effects. Polydrug abuse also refers to the use of different drugs at the same time.  Anytime drug use is interfering with normal living activities, it has become abuse. This includes problems with family and friends. Psychological dependence has developed when your mind tells you that the drug is needed. This  is usually followed by physical dependence which has developed when continuing increases of drug are required to get the same feeling or "high". This is known as addiction or chemical dependency. A person's risk is much higher if there is a history of chemical dependency in the family. SIGNS OF CHEMICAL DEPENDENCY  You have been told by friends or family that drugs have become a problem.  You fight when using drugs.  You are having blackouts (not remembering what you do while using).  You feel sick from using drugs but continue using.  You lie about use or amounts of drugs (chemicals) used.  You need chemicals to get you going.  You are suffering in work performance or in school because of drug use.  You get sick from use of drugs but continue to use anyway.  You need drugs to relate to people or feel comfortable in social situations.  You use drugs to forget problems. "Yes" answered to any of the above signs of chemical dependency indicates there are problems. The longer the use of drugs continues, the greater the problems will become. If there is a family history of drug or alcohol use, it is best not to experiment with these drugs. Continual use leads to tolerance. After tolerance develops more of the drug is needed to get the same feeling. This is followed by addiction. With addiction, drugs become the most important part of life. It becomes more important to take drugs than participate in the other usual activities of life. This includes relating to friends and family. Addiction is followed by dependency. Dependency is a condition where drugs are now needed not just to get high, but to feel normal. Addiction cannot be cured but it can be stopped. This often requires outside help and the care of professionals. Treatment centers are listed in the  yellow pages under: Cocaine, Narcotics, and Alcoholics Anonymous. Most hospitals and clinics can refer you to a specialized care center. Talk to  your caregiver if you need help.   This information is not intended to replace advice given to you by your health care provider. Make sure you discuss any questions you have with your health care provider.   Document Released: 07/06/2005 Document Revised: 02/06/2012 Document Reviewed: 11/19/2014 Elsevier Interactive Patient Education [employer]

## 2015-09-11 NOTE — ED Notes (Signed)
Patient resting quietly in room. No noted distress or abnormal behaviors noted. Will continue 15 minute checks and observation by security camera for safety. 

## 2015-09-11 NOTE — ED Notes (Signed)
Meal given. Patient resting quietly in room. No noted distress or abnormal behaviors noted. Will continue 15 minute checks and observation by security camera for safety. 

## 2015-09-11 NOTE — ED Notes (Signed)
Patient is calmer but remains very belligerent and sarcastic during verbal interactions. She is requesting to be discharged. She was told that she needed to wait for the psychiatrist, she became angry and walked away from this Clinical research associatewriter. Maintained on 15 minute checks and observation by security camera for safety.

## 2015-09-11 NOTE — ED Provider Notes (Addendum)
The patient is acting agitated, threatening towards staff at this time. The patient is taking catsup packets and painting them on the walls and throwing things about in her room. At this point, for safety we will medicate the patient with an additional 20 mg of Geodon intramuscularly which worked well and had excellent effect yesterday. Continue to await psychiatric consultation.  Sharyn CreamerMark Quale, MD 09/11/15 [phone]  ----------------------------------------- 2:04 PM on 09/11/2015 -----------------------------------------  Patient is calm, appropriate now. She has been seen by Dr. Toni Amendlapacs who advises the patient is appropriate for discharge and she has appointment with [facility] scheduled Monday. Patient provided return precautions, follow-up care in place. Involuntary commitment rescinded by Dr. Toni Amendlapacs.  Sharyn CreamerMark Quale, MD 09/11/15 [id number]

## 2015-09-11 NOTE — ED Notes (Signed)

## 2015-09-11 NOTE — BHH Counselor (Signed)
Per request of Psych MD (Dr. Toni Amendlapacs), scheduled follow up appointment with [facility] for Monday 09/14/2015 at 9:00am with Clydie BraunKaren. Information giving to patient and her father Molly Maduro(Robert [phone]). Verbal consent received from patient, to talk with the father, via phone.

## 2015-09-15 ENCOUNTER — Emergency Department
Admission: EM | Admit: 2015-09-15 | Discharge: 2015-09-16 | Disposition: A | Discharge status: Other Acute Inpatient Hospital | Payer: Medicaid Other | Attending: Emergency Medicine | Admitting: Emergency Medicine

## 2015-09-15 ENCOUNTER — Encounter: Payer: Self-pay | Admitting: Emergency Medicine

## 2015-09-15 DIAGNOSIS — F1210 Cannabis abuse, uncomplicated: Secondary | ICD-10-CM | POA: Diagnosis not present

## 2015-09-15 DIAGNOSIS — Z720 Tobacco use: Secondary | ICD-10-CM | POA: Diagnosis not present

## 2015-09-15 DIAGNOSIS — F918 Other conduct disorders: Secondary | ICD-10-CM | POA: Diagnosis present

## 2015-09-15 DIAGNOSIS — R451 Restlessness and agitation: Secondary | ICD-10-CM | POA: Insufficient documentation

## 2015-09-15 DIAGNOSIS — Z79899 Other long term (current) drug therapy: Secondary | ICD-10-CM | POA: Insufficient documentation

## 2015-09-15 DIAGNOSIS — Z5900 Homelessness unspecified: Secondary | ICD-10-CM

## 2015-09-15 DIAGNOSIS — F1010 Alcohol abuse, uncomplicated: Secondary | ICD-10-CM | POA: Diagnosis present

## 2015-09-15 DIAGNOSIS — F310 Bipolar disorder, current episode hypomanic: Secondary | ICD-10-CM | POA: Diagnosis present

## 2015-09-15 LAB — COMPREHENSIVE METABOLIC PANEL
ALBUMIN: 4.4 g/dL (ref 3.5–5.0)
ALK PHOS: 59 U/L (ref 38–126)
ALT: 25 U/L (ref 14–54)
ANION GAP: 5 (ref 5–15)
AST: 22 U/L (ref 15–41)
BILIRUBIN TOTAL: 0.6 mg/dL (ref 0.3–1.2)
BUN: 6 mg/dL (ref 6–20)
CALCIUM: 9 mg/dL (ref 8.9–10.3)
CO2: 27 mmol/L (ref 22–32)
Chloride: 105 mmol/L (ref 101–111)
Creatinine, Ser: 0.66 mg/dL (ref 0.44–1.00)
GFR calc non Af Amer: >60 mL/min (ref >60)
GLUCOSE: 116 mg/dL — AB (ref 65–99)
POTASSIUM: 3.5 mmol/L (ref 3.5–5.1)
SODIUM: 137 mmol/L (ref 135–145)
TOTAL PROTEIN: 7.6 g/dL (ref 6.5–8.1)

## 2015-09-15 LAB — CBC
HEMATOCRIT: 42.4 % (ref 35.0–47.0)
HEMOGLOBIN: 14.3 g/dL (ref 12.0–16.0)
MCH: 32.5 pg (ref 26.0–34.0)
MCHC: 33.6 g/dL (ref 32.0–36.0)
MCV: 96.5 fL (ref 80.0–100.0)
Platelets: 247 10*3/uL (ref 150–440)
RBC: 4.39 MIL/uL (ref 3.80–5.20)
RDW: 12.7 % (ref 11.5–14.5)
WBC: 7.8 10*3/uL (ref 3.6–11.0)

## 2015-09-15 LAB — URINE DRUG SCREEN, QUALITATIVE (ARMC ONLY)
Amphetamines, Ur Screen: NOT DETECTED
Barbiturates, Ur Screen: NOT DETECTED
Benzodiazepine, Ur Scrn: NOT DETECTED
COCAINE METABOLITE, UR ~~LOC~~: NOT DETECTED
Cannabinoid 50 Ng, Ur ~~LOC~~: POSITIVE — AB
MDMA (ECSTASY) UR SCREEN: NOT DETECTED
METHADONE SCREEN, URINE: NOT DETECTED
OPIATE, UR SCREEN: NOT DETECTED
Phencyclidine (PCP) Ur S: NOT DETECTED
TRICYCLIC, UR SCREEN: NOT DETECTED

## 2015-09-15 MED ORDER — LITHIUM CARBONATE 300 MG PO CAPS
600.0000 mg | ORAL_CAPSULE | Freq: Every day | ORAL | Status: DC
  Administered 2015-09-15: 600 mg via ORAL
  Filled 2015-09-15: qty 2

## 2015-09-15 MED ORDER — LORAZEPAM 2 MG/ML IJ SOLN
2.0000 mg | Freq: Four times a day (QID) | INTRAMUSCULAR | Status: DC | PRN
  Administered 2015-09-16: 2 mg via INTRAMUSCULAR
  Filled 2015-09-15: qty 1

## 2015-09-15 NOTE — ED Notes (Signed)
Per B[facility], [facility] and when pt did not see an MD as soon as she liked she became aggitated
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
## 2015-09-15 NOTE — ED Provider Notes (Signed)
[facility] Emergency Department Provider Note  ____________________________________________  Time seen: Approximately 12:35 PM  I have reviewed the triage vital signs and the nursing notes.   HISTORY  Chief Complaint Aggressive Behavior    HPI Misty Bradshaw is a 29 y.o. female with a history of bipolar disorder, marijuana abuse, and homelessness brought by police for aggressive behavior.  Patient reports that her dad "caught all up in my face" this morning; she then went to her uncle's house and the police came saying that they had IVC paperwork from her dad. She was brought to [facility] where she became agitated that she did not see a physician immediately and police was called. She denies any thoughts of hurting herself or anyone else, hallucinations, or recent medical illnesses. She is not having any pain.   Past Medical History  Diagnosis Date  . Depression   . Anxiety   . Bipolar 1 disorder [facility])     Patient Active Problem List   Diagnosis Date Noted  . Substance induced mood disorder ([facility]) 09/11/2015  . Alcohol abuse 09/11/2015  . Bipolar disorder, current episode hypomanic ([facility]) 09/04/2015  . Cannabis abuse 09/04/2015  . Homelessness 09/04/2015    History reviewed. No pertinent past surgical history.  Current Outpatient Rx  Name  Route  Sig  Dispense  Refill  . lithium carbonate 300 MG capsule   Oral   Take 300 mg by mouth 3 (three) times daily with meals.         Marland Kitchen. QUEtiapine (SEROQUEL) 200 MG tablet   Oral   Take 1 tablet (200 mg total) by mouth at bedtime.   30 tablet   0     Allergies Flagyl  History reviewed. No pertinent family history.  Social History Social History  Substance Use Topics  . Smoking status: Current Some Day Smoker  . Smokeless tobacco: None  . Alcohol Use: Yes    Review of Systems Constitutional: No fever/chills Eyes: No visual changes. ENT: No sore throat. Cardiovascular: Denies chest pain,  palpitations. Respiratory: Denies shortness of breath.  No cough. Gastrointestinal: No abdominal pain.  No nausea, no vomiting.  No diarrhea.  No constipation. Genitourinary: Negative for dysuria. Musculoskeletal: Negative for back pain. Skin: Negative for rash. Neurological: Negative for headaches, focal weakness or numbness. Psychiatric:Positive for agitation and aggressive behavior 10-point ROS otherwise negative.  ____________________________________________   PHYSICAL EXAM:  VITAL SIGNS: ED Triage Vitals  Enc Vitals Group     BP 09/15/15 1105 135/104 mmHg     Pulse Rate 09/15/15 1105 87     Resp 09/15/15 1105 20     Temp 09/15/15 1105 98.2 F (36.8 C)     Temp Source 09/15/15 1105 Oral     SpO2 09/15/15 1105 98 %     Weight 09/15/15 1105 190 lb (86.183 kg)     Height --      Head Cir --      Peak Flow --      Pain Score 09/15/15 1106 0     Pain Loc --      Pain Edu? --      Excl. in GC? --     Constitutional: Alert and oriented. Well appearing and in no acute distress. Answer question appropriately. Eyes: Conjunctivae are normal.  EOMI. Head: Atraumatic. Nose: No congestion/rhinnorhea. Mouth/Throat: Mucous membranes are moist.  Neck: No stridor.  Supple.  No meningismus Cardiovascular: Normal rate, regular rhythm. No murmurs, rubs or gallops.  Respiratory: Normal  respiratory effort.  No retractions. Lungs CTAB.  No wheezes, rales or ronchi. Gastrointestinal: Soft and nontender. No distention. No peritoneal signs. Musculoskeletal: No LE edema.  Neurologic:  Pressured speech and language but patient has normal comprehension and good insight.. No gross focal neurologic deficits are appreciated.  Skin:  Skin is warm, dry and intact. No rash noted. Psychiatric: Agitated affect with pressured speech. Does answer questions appropriately.  ____________________________________________   LABS (all labs ordered are listed, but only abnormal results are  displayed)  Labs Reviewed  COMPREHENSIVE METABOLIC PANEL - Abnormal; Notable for the following:    Glucose, Bld 116 (*)    All other components within normal limits  CBC  URINE DRUG SCREEN, QUALITATIVE ([facility] ONLY)   ____________________________________________  EKG  not indicated ____________________________________________  RADIOLOGY  No results found.  ____________________________________________   PROCEDURES  Procedure(s) performed: None  Critical Care performed: No ____________________________________________   INITIAL IMPRESSION / ASSESSMENT AND PLAN / ED COURSE  Pertinent labs & imaging results that were available during my care of the patient were reviewed by me and considered in my medical decision making (see chart for details).  29 y.o. female with a history of bipolar disorder and cannabis use presenting with agitation. At this time the patient does not have any SI, HI or hallucinations. Her vital signs are stable and she does not have any evidence of acute medical illness. I am awaiting the remainder of her labs for her medical clearance, but anticipated that she will be cleared and then disposition will be made by psych.   ____________________________________________  FINAL CLINICAL IMPRESSION(S) / ED DIAGNOSES  Final diagnoses:  Agitation      NEW MEDICATIONS STARTED DURING THIS VISIT:  New Prescriptions   No medications on file     Rockne Menghini, MD 09/15/15 1238

## 2015-09-15 NOTE — ED Notes (Signed)
Pt given lunch tray, pt still refusing to talk about what happened this AM, pt not tearful

## 2015-09-15 NOTE — ED Notes (Signed)
BEHAVIORAL HEALTH ROUNDING Patient sleeping: No. Patient alert and oriented: yes Behavior appropriate: Yes.  ; If no, describe:  Nutrition and fluids offered: yes Toileting and hygiene offered: Yes  Sitter present: q15 minute observations and security camera monitoring Law enforcement present: Yes  ODS  

## 2015-09-15 NOTE — ED Notes (Signed)
Pt very tearful, states "I am sad and hurt", when asked why pt shakes her head and refuses to talk, pt denies SI and HI at this time

## 2015-09-15 NOTE — ED Notes (Signed)

## 2015-09-15 NOTE — ED Notes (Signed)
Dining services called for lunch tray

## 2015-09-15 NOTE — ED Notes (Addendum)
Pt to ed with police and IVC papers.  Per pt father pt was aggressive and threatening to him at home.  Pt also is currently refusing meds.  Pt tearful at triage. Pt denies SI, denies HI.

## 2015-09-15 NOTE — ED Notes (Signed)
Pt. Noted in  room watching the tv.;. No complaints or concerns voiced. No distress or abnormal behavior noted. Will continue to monitor with security cameras. Q 15 minute rounds continue. 

## 2015-09-15 NOTE — ED Notes (Signed)
BEHAVIORAL HEALTH ROUNDING Patient sleeping: No. Patient alert and oriented: yes Behavior appropriate: Yes.  ;  Nutrition and fluids offered: Yes  Toileting and hygiene offered: Yes  Sitter present: yes Law enforcement present: Yes  

## 2015-09-15 NOTE — ED Notes (Signed)
ED BHU PLACEMENT JUSTIFICATION Is the patient under IVC or is there intent for IVC: Yes.   Is the patient medically cleared: Yes.   Is there vacancy in the ED BHU: Yes.   Is the population mix appropriate for patient: Yes.   Is the patient awaiting placement in inpatient or outpatient setting: Yes.  Admission to [facility] when staff/bed become available  Has the patient had a psychiatric consult: Yes.   Survey of unit performed for contraband, proper placement and condition of furniture, tampering with fixtures in bathroom, shower, and each patient room: Yes.  ; Findings:  APPEARANCE/BEHAVIOR Calm and cooperative NEURO ASSESSMENT Orientation: oriented x3  Denies pain Hallucinations: No.None noted (Hallucinations) Speech: Normal Gait: normal RESPIRATORY ASSESSMENT Even  Unlabored respirations  CARDIOVASCULAR ASSESSMENT Pulses equal   regular rate  Skin warm and dry   GASTROINTESTINAL ASSESSMENT no GI complaint EXTREMITIES Full ROM  PLAN OF CARE Provide calm/safe environment. Vital signs assessed twice daily. ED BHU Assessment once each 12-hour shift. Collaborate with intake RN daily or as condition indicates. Assure the ED provider has rounded once each shift. Provide and encourage hygiene. Provide redirection as needed. Assess for escalating behavior; address immediately and inform ED provider.  Assess family dynamic and appropriateness for visitation as needed: Yes.  ; If necessary, describe findings:  Educate the patient/family about BHU procedures/visitation: Yes.  ; If necessary, describe findings:

## 2015-09-15 NOTE — ED Notes (Signed)
She has used the telephone and she is now lying down  Pt observed with no unusual behavior  Appropriate to stimulation  No verbalized needs or concerns at this time  NAD assessed  Continue to monitor

## 2015-09-15 NOTE — ED Notes (Signed)
Pt verbally agitated at officer, asking what is problem is, officer spoke to pt and redirected her to her room

## 2015-09-15 NOTE — BH Assessment (Signed)
Assessment Note  Misty Bradshaw is an 29 y.o. female.    Per IVC documentation The patient is acting agitated, threatening towards others in her home. Pt denies this allegations stating that her father is the aggressor. Pt has visited the ED several times in the past two weeks. With similar complaints.  Patient has a past history of a diagnosis of bipolar disorder which has been tied up in the past with a history of substance abuse (Marijuana)  . 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 Pt was compliant with  lithium for a while but stopped going in 2015. Pt. denies any suicidal ideation, plan or intent. Pt. denies the presence of any auditory or visual hallucinations at this time. Patient denies any other medical complaints. In her last 2 visits to the emergency room here the pt illustrated extremely expansive and aggressive behaviors. A behavioral health assessment has been completed including evaluation of the patient, collecting collateral history:, reviewing available medical/clinic records, evaluating his unique risk and protective factors, and discussing treatment recommendations. Due to behaviors observed during previous visits and information provided on the pts IVC paperwork the patient does meet admission criteria at this time. This was explained to the pt, who voiced understanding.  Diagnosis: Bipolar Disorder      Past Medical History:  Past Medical History  Diagnosis Date  . Depression   . Anxiety   . Bipolar 1 disorder (HCC)     History reviewed. No pertinent past surgical history.  Family History: History reviewed. No pertinent family history.  Social History:  reports that she has been smoking.  She does not have any smokeless tobacco history on file. She reports that she drinks alcohol. She reports that she does not use illicit drugs.  Additional Social History:     CIWA: CIWA-Ar BP: (!) 135/104 mmHg Pulse Rate:  87 COWS:    Allergies:  Allergies  Allergen Reactions  . Flagyl [Metronidazole] Hives    Home Medications:  (Not in a hospital admission)  OB/GYN Status:  Patient's last menstrual period was 09/10/2015 (exact date).  General Assessment Data Location of Assessment: [facility] ED TTS Assessment: In system Is this a Tele or Face-to-Face Assessment?: Face-to-Face Is this an Initial Assessment or a Re-assessment for this encounter?: Initial Assessment Misty Bradshaw name: Dragovich Is patient pregnant?: Unknown Pregnancy Status: Unknown Living Arrangements: Parent (FATHER ) Can pt return to current living arrangement?:  (Unknown ) Admission Status: Involuntary Is patient capable of signing voluntary admission?: No Referral Source:  ([facility]) Insurance type: Medicaid   Medical Screening Exam [facility] Walk-in ONLY) Medical Exam completed: Yes  Crisis Care Plan Living Arrangements: Parent (FATHER ) Name of Psychiatrist:  (Unknown ) Name of Therapist:  (Unknown )  Education Status Is patient currently in school?: No  Risk to self with the past 6 months Suicidal Ideation: No Has patient been a risk to self within the past 6 months prior to admission? : No Suicidal Intent: No Has patient had any suicidal intent within the past 6 months prior to admission? : No Is patient at risk for suicide?: No Suicidal Plan?: No Has patient had any suicidal plan within the past 6 months prior to admission? : No Access to Means: No What has been your use of drugs/alcohol within the last 12 months?: Cannabinoid Previous Attempts/Gestures: No How many times?: 0 Intentional Self Injurious Behavior: None Family Suicide History: No Recent stressful life event(s): Turmoil (Comment) (  Conflict with father and living arrangements) Persecutory voices/beliefs?: No Depression: No Substance abuse history and/or treatment for substance abuse?: Yes Suicide prevention information given to non-admitted patients: Not  applicable  Risk to Others within the past 6 months Homicidal Ideation: No Does patient have any lifetime risk of violence toward others beyond the six months prior to admission? : No Thoughts of Harm to Others: No Current Homicidal Intent: No Current Homicidal Plan: No Access to Homicidal Means: No History of harm to others?: No Assessment of Violence: On admission Violent Behavior Description: Per reports pt hs threatened  family members  Does patient have access to weapons?: No Criminal Charges Pending?: No Does patient have a court date: No Is patient on probation?: No  Psychosis Hallucinations: None noted Delusions: None noted  Mental Status Report Appearance/Hygiene: In scrubs Eye Contact: Fair Motor Activity: Unremarkable Speech: Logical/coherent Level of Consciousness: Alert Mood: Anxious Affect: Appropriate to circumstance Anxiety Level: Minimal Judgement: Partial Orientation: Person, Place, Time, Situation Obsessive Compulsive Thoughts/Behaviors: None  Cognitive Functioning Concentration: Good Memory: Remote Intact, Recent Intact IQ: Average Insight: Poor Impulse Control: Poor Appetite: Fair Sleep: Decreased Total Hours of Sleep: 5 (Interrupted)  ADLScreening [facility] Assessment Services) Patient's cognitive ability adequate to safely complete daily activities?: Yes Patient able to express need for assistance with ADLs?: Yes Independently performs ADLs?: Yes (appropriate for developmental age)  Prior Inpatient Therapy Prior Inpatient Therapy: No  Prior Outpatient Therapy Prior Outpatient Therapy: Yes ([facility]) Prior Therapy Dates:  (11/2013) Does patient have an ACCT team?: No Does patient have Intensive In-House Services?  : No Does patient have [facility] services? : No Does patient have P4CC services?: No  ADL Screening (condition at time of admission) Patient's cognitive ability adequate to safely complete daily activities?: Yes Patient able to express  need for assistance with ADLs?: Yes Independently performs ADLs?: Yes (appropriate for developmental age)       Abuse/Neglect Assessment (Assessment to be complete while patient is alone) Physical Abuse: Denies Verbal Abuse: Denies Sexual Abuse: Denies Exploitation of patient/patient's resources: Denies Self-Neglect: Denies Values / Beliefs Cultural Requests During Hospitalization: None Spiritual Requests During Hospitalization: None Consults Spiritual Care Consult Needed: No Social Work Consult Needed: No Merchant navy officerAdvance Directives (For Healthcare) Does patient have an advance directive?: No Would patient like information on creating an advanced directive?: Yes English as a second language teacher- Educational materials given    Additional Information 1:1 In Past 12 Months?: No CIRT Risk: No Elopement Risk: No Does patient have medical clearance?: Yes     Disposition:  Disposition Initial Assessment Completed for this Encounter: Yes Disposition of Patient: Inpatient treatment program Type of inpatient treatment program: Adult  On Site Evaluation by:   Reviewed with Physician:    Asa SaunasShawanna N Lizet Kelso 09/15/2015 6:35 PM

## 2015-09-15 NOTE — ED Notes (Signed)
Pt. Noted in room watching the tv; received and accepted pm medication; and stated, "I'm being good.". No complaints or concerns voiced. No distress or abnormal behavior noted. Will continue to monitor with security cameras. Q 15 minute rounds continue.

## 2015-09-15 NOTE — ED Notes (Signed)
She has urinated on herself - clean undergarments/pad and pants provided    Appropriate to stimulation  No verbalized needs or concerns at this time  NAD assessed  Continue to monitor

## 2015-09-15 NOTE — ED Notes (Signed)
PT DRESSED OUT IN TRIAGE. HER BELONGINGS WERE PLACED IN A BAG. SHE HAS A BEADED BRACELET AND RING THAT WERE PLACED IN A SPECIMEN CUP. ALSO 2 NECKLACES AND A PAIR OF EARRINGS THAT WERE PLACED IN ANOTHER SPECIMEN CUP.

## 2015-09-15 NOTE — ED Notes (Signed)
Molly MaduroRobert (Father) [phone]

## 2015-09-15 NOTE — ED Notes (Signed)
Dad at bedside

## 2015-09-15 NOTE — ED Notes (Signed)
Pt transferred into ED BHU room 7    Patient assigned to appropriate care area. Patient oriented to unit/care area: Informed that, for their safety, care areas are designed for safety and monitored by security cameras at all times; Visiting hours and phone times explained to patient. Patient verbalizes understanding, and verbal contract for safety obtained.    Psychiatrist has seen her and she is to be admitted to [facility] pending bed/staff availability  Pt agreeable with the plan

## 2015-09-15 NOTE — Progress Notes (Signed)
TTS has been informed by Charge nurse Dedra Skeens(Gwen) that per her conversation with Sheralyn Boatmanoni this patient can not come to the inpatient unit due to staffing restraints. This has been communicated to the psychiatrist and [facility].   09/15/2015 Cheryl FlashNicole Laurabeth Yip, MS, NCC, LPCA Therapeutic Triage Specialist

## 2015-09-15 NOTE — ED Notes (Signed)

## 2015-09-15 NOTE — ED Notes (Signed)
Report received from Amy T., RN. Pt. Alert and oriented in no distress denies SI, HI, AVH and pain.  Pt. Instructed to come to me with problems or concerns.Will continue to monitor for safety via security cameras and Q 15 minute checks. 

## 2015-09-15 NOTE — ED Notes (Signed)
Resumed care from [facility].  Pt alert and watching tv.  Pt calm and cooperative.

## 2015-09-16 MED ORDER — LITHIUM CARBONATE 300 MG PO TABS: 300.0000 mg | ORAL_TABLET | Freq: Three times a day (TID) | ORAL | Status: DC

## 2015-09-16 NOTE — ED Notes (Signed)
Pt back in room, shower completed and pt is using phone.

## 2015-09-16 NOTE — ED Provider Notes (Signed)
-----------------------------------------   5:20 PM on 09/16/2015 -----------------------------------------   Blood pressure 123/78, pulse 78, temperature 98 F (36.7 C), temperature source Oral, resp. rate 18, weight 190 lb (86.183 kg), last menstrual period 09/10/2015, SpO2 98 %.  The patient had no acute events since last update.  Calm and cooperative at this time.  Disposition is pending per Psychiatry/Behavioral Medicine team recommendations.   Patient was seen and evaluated by the psychiatry team. He was felt the patient could be managed on an outpatient basis and IVC paperwork was removed and was recommended that she be discharged from the [facility] area. Patient has referral to our [facility] for further outpatient psychiatric evaluation will receive a prescription for lithium from the psychiatrist.  Daymon Larsen, MD 09/16/15 1721

## 2015-09-16 NOTE — Progress Notes (Signed)
Pr provided with a resource list and information for follow-up care and medication management.  09/16/2015 Cheryl FlashNicole Marilena Trevathan, MS, NCC, LPCA Therapeutic Triage Specialist

## 2015-09-16 NOTE — ED Notes (Signed)
Pt. Noted in room awake and requesting toiletry item. No complaints or concerns voiced. No distress or abnormal behavior noted. Will continue to monitor with security cameras. Q 15 minute rounds continue.

## 2015-09-16 NOTE — ED Notes (Signed)
BEHAVIORAL HEALTH ROUNDING Patient sleeping: No. Patient alert and oriented: yes Behavior appropriate: Yes.  ; If no, describe:  Nutrition and fluids offered: Yes  Toileting and hygiene offered: Yes  Sitter present: yes Law enforcement present: Yes ods.  Pt requests to use the phone and take a shower. Pt agrees to cooperate with her behavior during time of shower. Pt stated to tech that she would not bother us he rest of the day after her shower and use of phone.  Continue to monitor.

## 2015-09-16 NOTE — ED Notes (Signed)
Pt given discharge instructions at this time. Pt waiting her father to come and get here. Pt calm and cooperative at this time.

## 2015-09-16 NOTE — ED Notes (Signed)
Pt resting in room at this time.

## 2015-09-16 NOTE — Consult Note (Signed)
[facility] Psychiatry Consult   Reason for Consult:  Follow-up for 29 year old woman with a history of bipolar disorder and substance abuse. Patient had been tentatively planned for admission to the hospital but they do not feel comfortable with her based on her past history. On reevaluation today the patient says her mood is better. She is not feeling depressed and down. Denies suicidal or homicidal ideation. She has shown some irritability earlier this morning but for the most part has been calm and appears to be lucid. Tolerating medication without difficulty. We tried referring her to outside psychiatric hospitals and have had multiple refusals leaving [place] with no clear inpatient option. Referring Physician:  Thomasene Lot Patient Identification: Charish Schroepfer MRN:  [id number] Principal Diagnosis: Bipolar disorder, current episode hypomanic [facility]) Diagnosis:   Patient Active Problem List   Diagnosis Date Noted  . Substance induced mood disorder ([place]) [F19.94] 09/11/2015  . Alcohol abuse [F10.10] 09/11/2015  . Bipolar disorder, current episode hypomanic ([place]) [F31.0] 09/04/2015  . Cannabis abuse [F12.10] 09/04/2015  . Homelessness [Z59.0] 09/04/2015    Total Time spent with patient: 30 minutes  Subjective:   Kattie Santoyo is a 29 y.o. female patient admitted with "I'm feeling better than I was yesterday".  HPI:  See note from yesterday. Patient has been compliant with lithium since then. She is significantly more calm. Denies suicidal or homicidal ideation. Patient shows some insight and states that she knows she needs to get outpatient treatment. At this point she no longer had is clearly meeting commitment criteria  Past Psychiatric History: Long history of mood instability and substance abuse  Risk to Self: Suicidal Ideation: No Suicidal Intent: No Is patient at risk for suicide?: No Suicidal Plan?: No Access to Means: No What has been your use of drugs/alcohol within the last 12  months?: Cannabinoid How many times?: 0 Intentional Self Injurious Behavior: None Risk to Others: Homicidal Ideation: No Thoughts of Harm to Others: No Current Homicidal Intent: No Current Homicidal Plan: No Access to Homicidal Means: No History of harm to others?: No Assessment of Violence: On admission Violent Behavior Description: Per reports pt hs threatened  family members  Does patient have access to weapons?: No Criminal Charges Pending?: No Does patient have a court date: No Prior Inpatient Therapy: Prior Inpatient Therapy: No Prior Outpatient Therapy: Prior Outpatient Therapy: Yes ([facility]) Prior Therapy Dates:  (11/2013) Does patient have an ACCT team?: No Does patient have Intensive In-House Services?  : No Does patient have [facility] services? : No Does patient have P4CC services?: No  Past Medical History:  Past Medical History  Diagnosis Date  . Depression   . Anxiety   . Bipolar 1 disorder ([place])    History reviewed. No pertinent past surgical history. Family History: History reviewed. No pertinent family history. Family Psychiatric  History: Positive for mood instability and substance abuse Social History:  History  Alcohol Use  . Yes     History  Drug Use No    Social History   Social History  . Marital Status: Married    Spouse Name: N/A  . Number of Children: N/A  . Years of Education: N/A   Social History Main Topics  . Smoking status: Current Some Day Smoker  . Smokeless tobacco: None  . Alcohol Use: Yes  . Drug Use: No  . Sexual Activity: Not Asked   Other Topics Concern  . None   Social History Narrative   Additional Social History:  Allergies:   Allergies  Allergen Reactions  . Flagyl [Metronidazole] Hives    Labs:  Results for orders placed or performed during the hospital encounter of 09/15/15 (from the past 48 hour(s))  Comprehensive metabolic panel     Status: Abnormal   Collection Time:  09/15/15 11:09 AM  Result Value Ref Range   Sodium 137 135 - 145 mmol/L   Potassium 3.5 3.5 - 5.1 mmol/L   Chloride 105 101 - 111 mmol/L   CO2 27 22 - 32 mmol/L   Glucose, Bld 116 (H) 65 - 99 mg/dL   BUN 6 6 - 20 mg/dL   Creatinine, Ser 0.66 0.44 - 1.00 mg/dL   Calcium 9.0 8.9 - 10.3 mg/dL   Total Protein 7.6 6.5 - 8.1 g/dL   Albumin 4.4 3.5 - 5.0 g/dL   AST 22 15 - 41 U/L   ALT 25 14 - 54 U/L   Alkaline Phosphatase 59 38 - 126 U/L   Total Bilirubin 0.6 0.3 - 1.2 mg/dL   GFR calc non Af Amer >60 >60 mL/min   GFR calc Af Amer >60 >60 mL/min    Comment: (NOTE) The eGFR has been calculated using the CKD EPI equation. This calculation has not been validated in all clinical situations. eGFR's persistently <60 mL/min signify possible Chronic Kidney Disease.    Anion gap 5 5 - 15  CBC     Status: None   Collection Time: 09/15/15 11:09 AM  Result Value Ref Range   WBC 7.8 3.6 - 11.0 K/uL   RBC 4.39 3.80 - 5.20 MIL/uL   Hemoglobin 14.3 12.0 - 16.0 g/dL   HCT 42.4 35.0 - 47.0 %   MCV 96.5 80.0 - 100.0 fL   MCH 32.5 26.0 - 34.0 pg   MCHC 33.6 32.0 - 36.0 g/dL   RDW 12.7 11.5 - 14.5 %   Platelets 247 150 - 440 K/uL  Urine Drug Screen, Qualitative ([facility] only)     Status: Abnormal   Collection Time: 09/15/15 11:09 AM  Result Value Ref Range   Tricyclic, Ur Screen NONE DETECTED NONE DETECTED   Amphetamines, Ur Screen NONE DETECTED NONE DETECTED   MDMA (Ecstasy)Ur Screen NONE DETECTED NONE DETECTED   Cocaine Metabolite,Ur [place] NONE DETECTED NONE DETECTED   Opiate, Ur Screen NONE DETECTED NONE DETECTED   Phencyclidine (PCP) Ur S NONE DETECTED NONE DETECTED   Cannabinoid 50 Ng, Ur [place] POSITIVE (A) NONE DETECTED   Barbiturates, Ur Screen NONE DETECTED NONE DETECTED   Benzodiazepine, Ur Scrn NONE DETECTED NONE DETECTED   Methadone Scn, Ur NONE DETECTED NONE DETECTED    Comment: (NOTE) 354  Tricyclics, urine               Cutoff 1000 ng/mL 200  Amphetamines, urine             Cutoff 1000  ng/mL 300  MDMA (Ecstasy), urine           Cutoff 500 ng/mL 400  Cocaine Metabolite, urine       Cutoff 300 ng/mL 500  Opiate, urine                   Cutoff 300 ng/mL 600  Phencyclidine (PCP), urine      Cutoff 25 ng/mL 700  Cannabinoid, urine              Cutoff 50 ng/mL 800  Barbiturates, urine             Cutoff  200 ng/mL 900  Benzodiazepine, urine           Cutoff 200 ng/mL 1000 Methadone, urine                Cutoff 300 ng/mL 1100 1200 The urine drug screen provides only a preliminary, unconfirmed 1300 analytical test result and should not be used for non-medical 1400 purposes. Clinical consideration and professional judgment should 1500 be applied to any positive drug screen result due to possible 1600 interfering substances. A more specific alternate chemical method 1700 must be used in order to obtain a confirmed analytical result.  1800 Gas chromato graphy / mass spectrometry (GC/MS) is the preferred 1900 confirmatory method.     Current Facility-Administered Medications  Medication Dose Route Frequency Provider Last Rate Last Dose  . lithium carbonate capsule 600 mg  600 mg Oral QHS Gonzella Lex, MD   600 mg at 09/15/15 2149  . LORazepam (ATIVAN) injection 2 mg  2 mg Intramuscular Q6H PRN Schuyler Amor, MD   2 mg at 09/16/15 5465   Current Outpatient Prescriptions  Medication Sig Dispense Refill  . lithium carbonate 300 MG capsule Take 300 mg by mouth 3 (three) times daily with meals.    Marland Kitchen lithium 300 MG tablet Take 1 tablet (300 mg total) by mouth 3 (three) times daily. 90 tablet 0  . QUEtiapine (SEROQUEL) 200 MG tablet Take 1 tablet (200 mg total) by mouth at bedtime. 30 tablet 0    Musculoskeletal: Strength & Muscle Tone: within normal limits Gait & Station: normal Patient leans: N/A  Psychiatric Specialty Exam: Review of Systems  Constitutional: Negative.   HENT: Negative.   Eyes: Negative.   Respiratory: Negative.   Cardiovascular: Negative.    Gastrointestinal: Negative.   Musculoskeletal: Negative.   Skin: Negative.   Neurological: Negative.   Psychiatric/Behavioral: Negative for depression, suicidal ideas, hallucinations, memory loss and substance abuse. The patient is not nervous/anxious and does not have insomnia.     Blood pressure 123/78, pulse 78, temperature 98 F (36.7 C), temperature source Oral, resp. rate 18, weight 86.183 kg (190 lb), last menstrual period 09/10/2015, SpO2 98 %.Body mass index is 30.68 kg/(m^2).  General Appearance: Casual  Eye Contact::  Good  Speech:  Normal Rate  Volume:  Normal  Mood:  Euthymic  Affect:  Congruent  Thought Process:  Intact  Orientation:  Full (Time, Place, and Person)  Thought Content:  Negative  Suicidal Thoughts:  No  Homicidal Thoughts:  No  Memory:  Immediate;   Good Recent;   Fair Remote;   Good  Judgement:  Intact  Insight:  Fair  Psychomotor Activity:  Negative and Normal  Concentration:  Fair  Recall:  [employer] of Knowledge:Fair  Language: Fair  Akathisia:  No  Handed:  Right  AIMS (if indicated):     Assets:  Communication Skills Desire for Improvement Housing Physical Health Resilience  ADL's:  Intact  Cognition: WNL  Sleep:      Treatment Plan Summary: Medication management and Plan Patient is significantly improved over yesterday. No longer clearly needs inpatient hospitalization. In any case it appears to be unlikely that we will find a inpatient bed for her. Plan instead will be to discontinue commitment and discontinue patient from the emergency room. She was counseled and strong terms that she really needs to follow-up with [facility] and to stay on her medicine. Warned not to overuse the medicine. Patient should discontinue the abuse of drugs. She plans  to go back and live with her father which she knows may BE an explosive situation. Is reviewed with emergency room doctor.  Disposition: No evidence of imminent risk to self or others at present.    Patient does not meet criteria for psychiatric inpatient admission. Supportive therapy provided about ongoing stressors.  Kortney Schoenfelder 09/16/2015 5:01 PM

## 2015-09-16 NOTE — ED Notes (Signed)
Pt. Noted in room resting quietly;. No complaints or concerns voiced. No distress or abnormal behavior noted. Will continue to monitor with security cameras. Q 15 minute rounds continue. 

## 2015-09-16 NOTE — ED Notes (Signed)

## 2015-09-16 NOTE — Progress Notes (Signed)
TTS followed up with referral sources:  [facility]- Pt has been denied at due to her insurance being out of network.  [facility]- On [facility]   09/16/2015 Cheryl FlashNicole Meric Joye, MS, NCC, LPCA Therapeutic Triage Specialist

## 2015-09-16 NOTE — ED Notes (Signed)
Pt still resting in room at this time. Continue to monitor.

## 2015-09-16 NOTE — ED Notes (Signed)
Pt. Noted in room awake watching the tv. No complaints or concerns voiced. No distress or abnormal behavior noted. Will continue to monitor with security cameras. Q 15 minute rounds continue. 

## 2015-09-16 NOTE — ED Notes (Signed)
pts father here to visit with observation.

## 2015-09-16 NOTE — ED Notes (Addendum)
BEHAVIORAL HEALTH ROUNDING Patient sleeping: No. Patient alert and oriented: yes Behavior appropriate: No.; If no, describe: ;see ED nurses notes filed  Nutrition and fluids offered: Yes  Toileting and hygiene offered:yes Sitter present: yes Law enforcement present: Yes ODS

## 2015-09-16 NOTE — ED Notes (Signed)
Pt. Noted in room now; but she did get up to request another blanket; currently. No complaints or concerns voiced. No distress or abnormal behavior noted. Will continue to monitor with security cameras. Q 15 minute rounds continue.

## 2015-09-16 NOTE — ED Notes (Signed)
Signature pad not working at time of discharge. Pt signed copy of instructions which have been placed on her chart.

## 2015-09-16 NOTE — Discharge Instructions (Signed)
Suicidal Feelings: How to Help Yourself °Suicide is the taking of one's own life. If you feel as though life is getting too tough to handle and are thinking about suicide, get help right away. To get help: °· Call your local emergency services (911 in the [place] °· Call a suicide hotline to speak with a trained counselor who understands how you are feeling. The following is a list of suicide hotlines in the [place]. For a list of hotlines in [place], visit www.suicide.org/hotlines/international/[place]-suicide-hotlines.html. °¨  1-800-273-TALK (1-800-273-8255). °¨  1-800-SUICIDE (1-800-784-2433). °¨  1-888-628-9454. This is a hotline for Spanish speakers. °¨  1-800-799-4TTY (1-800-799-4889). This is a hotline for TTY users. °¨  1-866-4-U-TREVOR (1-866-488-7386). This is a hotline for lesbian, gay, bisexual, transgender, or questioning youth. °· Contact a crisis center or a local suicide prevention center. To find a crisis center or suicide prevention center: °¨ Call your local hospital, clinic, community service organization, mental health center, social service provider, or health department. Ask for assistance in connecting to a crisis center. °¨ Visit www.suicidepreventionlifeline.org/getinvolved/locator for a list of crisis centers in the [place], or visit www.suicideprevention.[place]/thinking-about-suicide/find-a-crisis-centre for a list of centers in [place]. °· Visit the following websites: °¨  National Suicide Prevention Lifeline: www.suicidepreventionlifeline.org °¨  Hopeline: www.hopeline.com °¨  American Foundation for Suicide Prevention: www.afsp.org °¨  The Trevor Project (for lesbian, gay, bisexual, transgender, or questioning youth): www.thetrevorproject.org °HOW CAN I HELP MYSELF FEEL BETTER? °· Promise yourself that you will not do anything drastic when you have suicidal feelings. Remember, there is hope. Many people have gotten through suicidal thoughts and feelings, and you will, too. You may  have gotten through them before, and this proves that you can get through them again. °· Let family, friends, teachers, or counselors know how you are feeling. Try not to isolate yourself from those who care about you. Remember, they will want to help you. Talk with someone every day, even if you do not feel sociable. Face-to-face conversation is best. °· Call a mental health professional and see one regularly. °· Visit your primary health care provider every year. °· Eat a well-balanced diet, and space your meals so you eat regularly. °· Get plenty of rest. °· Avoid alcohol and drugs, and remove them from your home. They will only make you feel worse. °· If you are thinking of taking a lot of medicine, give your medicine to someone who can give it to you one day at a time. If you are on antidepressants and are concerned you will overdose, let your health care provider know so he or she can give you safer medicines. Ask your mental health professional about the possible side effects of any medicines you are taking. °· Remove weapons, poisons, knives, and anything else that could harm you from your home. °· Try to stick to routines. Follow a schedule every day. Put self-care on your schedule. °· Make a list of realistic goals, and cross them off when you achieve them. Accomplishments give a sense of worth. °· Wait until you are feeling better before doing the things you find difficult or unpleasant. °· Exercise if you are able. You will feel better if you exercise for even a half hour each day. °· Go out in the sun or into nature. This will help you recover from depression faster. If you have a favorite place to walk, go there. °· Do the things that have always given you pleasure. Play your favorite music, read a good book, paint a picture, play your favorite instrument, or do anything   else that takes your mind off your depression if it is safe to do. °· Keep your living space well lit. °· When you are feeling well,  write yourself a letter about tips and support that you can read when you are not feeling well. °· Remember that life's difficulties can be sorted out with help. Conditions can be treated. You can work on thoughts and strategies that serve you well. °  °This information is not intended to replace advice given to you by your health care provider. Make sure you discuss any questions you have with your health care provider. °  °Document Released: 05/21/2003 Document Revised: 12/05/2014 Document Reviewed: 03/11/2014 °Elsevier Interactive Patient Education ©2016 Elsevier Inc. ° °

## 2015-09-16 NOTE — ED Notes (Signed)
Pt remains in room resting at this time.

## 2015-09-16 NOTE — ED Notes (Signed)
Pt noted to have thrown her leftover breakfast out in the hallway and is giving her self a bath in the bathroom with use of a napkin and water from sink.

## 2015-09-16 NOTE — ED Notes (Signed)
Pt sleeping in room at this time.

## 2015-09-16 NOTE — ED Notes (Signed)
Pt noted to be back in her room sitting on the bed.

## 2015-09-16 NOTE — ED Notes (Signed)
Per charge RN, [facility] has denied pt admission at this time. Will notify Clapacs when he is rounding about discontinuing admission orders.

## 2015-09-16 NOTE — ED Notes (Signed)
BEHAVIORAL HEALTH ROUNDING Patient sleeping: No. Patient alert and oriented: yes Behavior appropriate: Yes.  ; If no, describe:  Nutrition and fluids offered: Yes  Toileting and hygiene offered: Yes  Sitter present: 15 min checks and security cameras in process Law enforcement present: Yes ods 

## 2015-09-16 NOTE — ED Notes (Addendum)
Upon recving report, pt beating on the doors and walls demanding a shower and breakfast, yelling at staff and cursing calling nurses names. With assist from staff RN, ED Tech and three [employer] officers,  IM ativan given in the  Right deltoid per order, see MAR. Pt took injection willingly.  Continue to monitor.

## 2015-09-16 NOTE — ED Notes (Signed)
Pt requests to use the phone at this time.

## 2015-09-16 NOTE — ED Provider Notes (Signed)
-----------------------------------------   1:27 PM on 09/16/2015 -----------------------------------------   BP 123/78 mmHg  Pulse 78  Temp(Src) 98 F (36.7 C) (Oral)  Resp 18  Wt 190 lb (86.183 kg)  SpO2 98%  LMP 09/10/2015 (Exact Date)  I have seen the patient and reviewed the patients recent care with the nursing staff.  The patient has had no acute events since last update.  The patient is calm and cooperative at this time.  Disposition is pending per Psychiatry/Behavioral Medicine team recommendations.    Darien Ramusavid W Ching Rabideau, MD 09/16/15 [phone]

## 2015-09-16 NOTE — ED Notes (Signed)
Pt continues to rest quietly in the room at this time.

## 2015-09-16 NOTE — ED Notes (Signed)
Pt in shower at this time

## 2015-09-16 NOTE — ED Notes (Signed)
Per night shift RN, pt has been admitted to Behavioral Medicine Unit downstairs here at this facility. No beds available at this time and will not be transferred downstairs due to space availability and pt behavior. Pt with hx of aggressive behavior towards staff and noted to have wiped feces and urine in the walls in the ER Lock down unit.

## 2015-09-16 NOTE — ED Notes (Signed)
PATIENT USED PHONE

## 2015-09-16 NOTE — ED Notes (Signed)
BEHAVIORAL HEALTH ROUNDING Patient sleeping: No. Patient alert and oriented: yes Behavior appropriate: Yes.  ; If no, describe:  Nutrition and fluids offered: Yes  Toileting and hygiene offered: Yes  Sitter present: yes Law enforcement present: Yes ods  Pt still remains calm at this time. Continue to monitor.

## 2015-09-22 ENCOUNTER — Emergency Department
Admission: EM | Admit: 2015-09-22 | Discharge: 2015-09-22 | Disposition: A | Discharge status: Home / Self Care | Payer: Medicaid Other | Attending: Emergency Medicine | Admitting: Emergency Medicine

## 2015-09-22 DIAGNOSIS — Z79899 Other long term (current) drug therapy: Secondary | ICD-10-CM | POA: Diagnosis not present

## 2015-09-22 DIAGNOSIS — K219 Gastro-esophageal reflux disease without esophagitis: Secondary | ICD-10-CM | POA: Insufficient documentation

## 2015-09-22 DIAGNOSIS — Z720 Tobacco use: Secondary | ICD-10-CM | POA: Diagnosis not present

## 2015-09-22 DIAGNOSIS — Z3202 Encounter for pregnancy test, result negative: Secondary | ICD-10-CM | POA: Diagnosis not present

## 2015-09-22 DIAGNOSIS — R0789 Other chest pain: Secondary | ICD-10-CM

## 2015-09-22 DIAGNOSIS — R079 Chest pain, unspecified: Secondary | ICD-10-CM | POA: Diagnosis present

## 2015-09-22 LAB — URINALYSIS COMPLETE WITH MICROSCOPIC (ARMC ONLY)
BILIRUBIN URINE: NEGATIVE
GLUCOSE, UA: NEGATIVE mg/dL
Hgb urine dipstick: NEGATIVE
Ketones, ur: NEGATIVE mg/dL
Nitrite: NEGATIVE
PH: 6 (ref 5.0–8.0)
Protein, ur: NEGATIVE mg/dL
Specific Gravity, Urine: 1.016 (ref 1.005–1.030)

## 2015-09-22 LAB — POCT PREGNANCY, URINE: PREG TEST UR: NEGATIVE

## 2015-09-22 LAB — PREGNANCY, URINE: Preg Test, Ur: NEGATIVE

## 2015-09-22 MED ORDER — NAPROXEN 500 MG PO TABS: 500.0000 mg | ORAL_TABLET | Freq: Two times a day (BID) | ORAL | Status: DC

## 2015-09-22 MED ORDER — SUCRALFATE 1 G PO TABS: 1.0000 g | ORAL_TABLET | Freq: Four times a day (QID) | ORAL | Status: DC

## 2015-09-22 MED ORDER — RANITIDINE HCL 150 MG PO CAPS: 150.0000 mg | ORAL_CAPSULE | Freq: Two times a day (BID) | ORAL | Status: DC

## 2015-09-22 MED ORDER — IBUPROFEN 800 MG PO TABS
800.0000 mg | ORAL_TABLET | Freq: Once | ORAL | Status: AC
  Administered 2015-09-22: 800 mg via ORAL
  Filled 2015-09-22: qty 1

## 2015-09-22 MED ORDER — GI COCKTAIL ~~LOC~~
30.0000 mL | ORAL | Status: AC
  Administered 2015-09-22: 30 mL via ORAL
  Filled 2015-09-22: qty 30

## 2015-09-22 MED ORDER — FAMOTIDINE 20 MG PO TABS
40.0000 mg | ORAL_TABLET | Freq: Once | ORAL | Status: AC
  Administered 2015-09-22: 40 mg via ORAL
  Filled 2015-09-22: qty 2

## 2015-09-22 NOTE — ED Notes (Signed)
Pt presents via EMS with chest discomfort that worsens with movement. Pt also c/o back pain and stomach pain.

## 2015-09-22 NOTE — Discharge Instructions (Signed)
Chest Wall Pain Chest wall pain is pain in or around the bones and muscles of your chest. Sometimes, an injury causes this pain. Sometimes, the cause may not be known. This pain may take several weeks or longer to get better. HOME CARE INSTRUCTIONS  Pay attention to any changes in your symptoms. Take these actions to help with your pain:   Rest as told by your health care provider.   Avoid activities that cause pain. These include any activities that use your chest muscles or your abdominal and side muscles to lift heavy items.   If directed, apply ice to the painful area:  Put ice in a plastic bag.  Place a towel between your skin and the bag.  Leave the ice on for 20 minutes, 2-3 times per day.  Take over-the-counter and prescription medicines only as told by your health care provider.  Do not use tobacco products, including cigarettes, chewing tobacco, and e-cigarettes. If you need help quitting, ask your health care provider.  Keep all follow-up visits as told by your health care provider. This is important. SEEK MEDICAL CARE IF:  You have a fever.  Your chest pain becomes worse.  You have new symptoms. SEEK IMMEDIATE MEDICAL CARE IF:  You have nausea or vomiting.  You feel sweaty or light-headed.  You have a cough with phlegm (sputum) or you cough up blood.  You develop shortness of breath.   This information is not intended to replace advice given to you by your health care provider. Make sure you discuss any questions you have with your health care provider.   Document Released: 11/14/2005 Document Revised: 08/05/2015 Document Reviewed: 02/09/2015 Elsevier Interactive Patient Education 2016 Elsevier Inc.  Gastroesophageal Reflux Disease, Adult Normally, food travels down the esophagus and stays in the stomach to be digested. However, when a person has gastroesophageal reflux disease (GERD), food and stomach acid move back up into the esophagus. When this  happens, the esophagus becomes sore and inflamed. Over time, GERD can create small holes (ulcers) in the lining of the esophagus.  CAUSES This condition is caused by a problem with the muscle between the esophagus and the stomach (lower esophageal sphincter, or LES). Normally, the LES muscle closes after food passes through the esophagus to the stomach. When the LES is weakened or abnormal, it does not close properly, and that allows food and stomach acid to go back up into the esophagus. The LES can be weakened by certain dietary substances, medicines, and medical conditions, including:  Tobacco use.  Pregnancy.  Having a hiatal hernia.  Heavy alcohol use.  Certain foods and beverages, such as coffee, chocolate, onions, and peppermint. RISK FACTORS This condition is more likely to develop in:  People who have an increased body weight.  People who have connective tissue disorders.  People who use NSAID medicines. SYMPTOMS Symptoms of this condition include:  Heartburn.  Difficult or painful swallowing.  The feeling of having a lump in the throat.  Abitter taste in the mouth.  Bad breath.  Having a large amount of saliva.  Having an upset or bloated stomach.  Belching.  Chest pain.  Shortness of breath or wheezing.  Ongoing (chronic) cough or a night-time cough.  Wearing away of tooth enamel.  Weight loss. Different conditions can cause chest pain. Make sure to see your health care provider if you experience chest pain. DIAGNOSIS Your health care provider will take a medical history and perform a physical exam. To determine if  you have mild or severe GERD, your health care provider may also monitor how you respond to treatment. You may also have other tests, including:  An endoscopy toexamine your stomach and esophagus with a small camera.  A test thatmeasures the acidity level in your esophagus.  A test thatmeasures how much pressure is on your  esophagus.  A barium swallow or modified barium swallow to show the shape, size, and functioning of your esophagus. TREATMENT The goal of treatment is to help relieve your symptoms and to prevent complications. Treatment for this condition may vary depending on how severe your symptoms are. Your health care provider may recommend:  Changes to your diet.  Medicine.  Surgery. HOME CARE INSTRUCTIONS Diet  Follow a diet as recommended by your health care provider. This may involve avoiding foods and drinks such as:  Coffee and tea (with or without caffeine).  Drinks that containalcohol.  Energy drinks and sports drinks.  Carbonated drinks or sodas.  Chocolate and cocoa.  Peppermint and mint flavorings.  Garlic and onions.  Horseradish.  Spicy and acidic foods, including peppers, chili powder, curry powder, vinegar, hot sauces, and barbecue sauce.  Citrus fruit juices and citrus fruits, such as oranges, lemons, and limes.  Tomato-based foods, such as red sauce, chili, salsa, and pizza with red sauce.  Fried and fatty foods, such as donuts, french fries, potato chips, and high-fat dressings.  High-fat meats, such as hot dogs and fatty cuts of red and white meats, such as rib eye steak, sausage, ham, and bacon.  High-fat dairy items, such as whole milk, butter, and cream cheese.  Eat small, frequent meals instead of large meals.  Avoid drinking large amounts of liquid with your meals.  Avoid eating meals during the 2-3 hours before bedtime.  Avoid lying down right after you eat.  Do not exercise right after you eat. General Instructions  Pay attention to any changes in your symptoms.  Take over-the-counter and prescription medicines only as told by your health care provider. Do not take aspirin, ibuprofen, or other NSAIDs unless your health care provider told you to do so.  Do not use any tobacco products, including cigarettes, chewing tobacco, and e-cigarettes.  If you need help quitting, ask your health care provider.  Wear loose-fitting clothing. Do not wear anything tight around your waist that causes pressure on your abdomen.  Raise (elevate) the head of your bed 6 inches (15cm).  Try to reduce your stress, such as with yoga or meditation. If you need help reducing stress, ask your health care provider.  If you are overweight, reduce your weight to an amount that is healthy for you. Ask your health care provider for guidance about a safe weight loss goal.  Keep all follow-up visits as told by your health care provider. This is important. SEEK MEDICAL CARE IF:  You have new symptoms.  You have unexplained weight loss.  You have difficulty swallowing, or it hurts to swallow.  You have wheezing or a persistent cough.  Your symptoms do not improve with treatment.  You have a hoarse voice. SEEK IMMEDIATE MEDICAL CARE IF:  You have pain in your arms, neck, jaw, teeth, or back.  You feel sweaty, dizzy, or light-headed.  You have chest pain or shortness of breath.  You vomit and your vomit looks like blood or coffee grounds.  You faint.  Your stool is bloody or black.  You cannot swallow, drink, or eat.   This information is not  intended to replace advice given to you by your health care provider. Make sure you discuss any questions you have with your health care provider.   Document Released: 08/24/2005 Document Revised: 08/05/2015 Document Reviewed: 03/11/2015 Elsevier Interactive Patient Education [employer]

## 2015-09-22 NOTE — ED Provider Notes (Signed)
[facility] Emergency Department Provider Note  ____________________________________________  Time seen: 4:40 PM  I have reviewed the triage vital signs and the nursing notes.   HISTORY  Chief Complaint Chest Pain    HPI Misty Bradshaw is a 29 y.o. female who complains of mid chest pain that started this morning after drinking some water. Pain is intermittent lasting for a few minutes at a time, nonradiating, no shortness of breath diaphoresis nausea or vomiting. She also complains of low back pain that is worse when she bends or twists her upper body. She also complains of lower abdominal pain and reports that she normally doesn't stay very well-hydrated and holds her urine and urinates very infrequently. No exertional or pleuritic component to the symptoms. No dizziness or syncope. She has also been having a nonproductive cough for the last 2 days as well.    Past Medical History  Diagnosis Date  . Depression   . Anxiety   . Bipolar 1 disorder [facility])      Patient Active Problem List   Diagnosis Date Noted  . Substance induced mood disorder (HCC) 09/11/2015  . Alcohol abuse 09/11/2015  . Bipolar disorder, current episode hypomanic (HCC) 09/04/2015  . Cannabis abuse 09/04/2015  . Homelessness 09/04/2015     History reviewed. No pertinent past surgical history.   Current Outpatient Rx  Name  Route  Sig  Dispense  Refill  . lithium 300 MG tablet   Oral   Take 1 tablet (300 mg total) by mouth 3 (three) times daily.   90 tablet   0   . naproxen (NAPROSYN) 500 MG tablet   Oral   Take 1 tablet (500 mg total) by mouth 2 (two) times daily with a meal.   20 tablet   0   . ranitidine (ZANTAC) 150 MG capsule   Oral   Take 1 capsule (150 mg total) by mouth 2 (two) times daily.   28 capsule   0   . sucralfate (CARAFATE) 1 G tablet   Oral   Take 1 tablet (1 g total) by mouth 4 (four) times daily.   120 tablet   1       Allergies Flagyl   No family history on file.  Social History Social History  Substance Use Topics  . Smoking status: Current Some Day Smoker  . Smokeless tobacco: None  . Alcohol Use: Yes    Review of Systems  Constitutional:   No fever or chills. No weight changes Eyes:   No blurry vision or double vision.  ENT:   No sore throat. Cardiovascular:   Positive as above chest pain. Respiratory:   No dyspnea positive nonproductive cough. Gastrointestinal:   Positive suprapubic abdominal pain, without vomiting and diarrhea.  No BRBPR or melena. Genitourinary:   Negative for dysuria, urinary retention, bloody urine, or difficulty urinating. Musculoskeletal:   Positive low back pain, denies recent trauma Skin:   Negative for rash. Neurological:   Negative for headaches, focal weakness or numbness. Psychiatric:  No anxiety or depression.   Endocrine:  No hot/cold intolerance, changes in energy, or sleep difficulty.  10-point ROS otherwise negative.  ____________________________________________   PHYSICAL EXAM:  VITAL SIGNS: ED Triage Vitals  Enc Vitals Group     BP 09/22/15 1627 125/84 mmHg     Pulse Rate 09/22/15 1622 77     Resp 09/22/15 1622 20     Temp 09/22/15 1622 98.1 F (36.7 C)     Temp  Source 09/22/15 1622 Oral     SpO2 09/22/15 1622 100 %     Weight 09/22/15 1627 190 lb (86.183 kg)     Height 09/22/15 1627 5\' 6"  (1.676 m)     Head Cir --      Peak Flow --      Pain Score 09/22/15 1624 7     Pain Loc --      Pain Edu? --      Excl. in GC? --      Constitutional:   Alert and oriented. Well appearing and in no distress. Eyes:   No scleral icterus. No conjunctival pallor. PERRL. EOMI ENT   Head:   Normocephalic and atraumatic.   Nose:   No congestion/rhinnorhea. No septal hematoma   Mouth/Throat:   MMM, no pharyngeal erythema. No peritonsillar mass. No uvula shift.   Neck:   No stridor. No SubQ emphysema. No  meningismus. Hematological/Lymphatic/Immunilogical:   No cervical lymphadenopathy. Cardiovascular:   RRR. Normal and symmetric distal pulses are present in all extremities. No murmurs, rubs, or gallops. Respiratory:   Normal respiratory effort without tachypnea nor retractions. Breath sounds are clear and equal bilaterally. No wheezes/rales/rhonchi. Gastrointestinal:   Soft with suprapubic tenderness. No distention. There is no CVA tenderness.  No rebound, rigidity, or guarding. Genitourinary:   deferred Musculoskeletal:   Anterior chest wall tenderness that reproduces the chest pain. No midline spinal tenderness. No significant soft tissue muscular tenderness in the back.  Neurologic:   Normal speech and language.  CN 2-10 normal. Motor grossly intact. No pronator drift.  Normal gait. No gross focal neurologic deficits are appreciated.  Skin:    Skin is warm, dry and intact. No rash noted.  No petechiae, purpura, or bullae. Psychiatric:   Mood and affect are normal. Speech and behavior are normal. Patient exhibits appropriate insight and judgment.  ____________________________________________    LABS (pertinent positives/negatives) (all labs ordered are listed, but only abnormal results are displayed) Labs Reviewed  URINALYSIS COMPLETEWITH MICROSCOPIC ([facility] ONLY) - Abnormal; Notable for the following:    Color, Urine YELLOW (*)    APPearance CLOUDY (*)    Leukocytes, UA TRACE (*)    Bacteria, UA RARE (*)    Squamous Epithelial / LPF TOO NUMEROUS TO COUNT (*)    All other components within normal limits  PREGNANCY, URINE  POCT PREGNANCY, URINE   ____________________________________________   EKG  Interpreted by me Normal sinus rhythm rate of 71, normal axis intervals QRS and ST segments and T waves. Normal EKG  ____________________________________________     RADIOLOGY    ____________________________________________   PROCEDURES   ____________________________________________   INITIAL IMPRESSION / ASSESSMENT AND PLAN / ED COURSE  Pertinent labs & imaging results that were available during my care of the patient were reviewed by me and considered in my medical decision making (see chart for details).  Patient's symptoms appear to be musculoskeletal in nature, possibly related to some acid reflux. We'll give her antacids and ibuprofen. We'll also check a urinalysis due to the urinary symptoms and suprapubic tenderness. She is overall stable and suitable for outpatient follow-up pending results of the urinalysis.  ----------------------------------------- 6:09 PM on 09/22/2015 -----------------------------------------  UA negative. Low suspicion for ACS PE TAD pneumothorax carditis mediastinitis pneumonia or sepsis. This may be musculoskeletal chest wall pain and likely some GERD as well. She may be developing a viral illness. We'll keep her on NSAIDs and a brief course of acid suppression have her follow up with  primary care in a week.   ____________________________________________   FINAL CLINICAL IMPRESSION(S) / ED DIAGNOSES  Final diagnoses:  Chest pain, musculoskeletal  Gastroesophageal reflux disease, esophagitis presence not specified      Sharman Cheek, MD 09/22/15 1810

## 2015-09-30 ENCOUNTER — Emergency Department
Admission: EM | Admit: 2015-09-30 | Discharge: 2015-10-01 | Discharge status: Against Medical Advice | Payer: Medicaid Other | Attending: Emergency Medicine | Admitting: Emergency Medicine

## 2015-09-30 DIAGNOSIS — R300 Dysuria: Secondary | ICD-10-CM | POA: Diagnosis not present

## 2015-09-30 DIAGNOSIS — F17200 Nicotine dependence, unspecified, uncomplicated: Secondary | ICD-10-CM | POA: Insufficient documentation

## 2015-09-30 DIAGNOSIS — R102 Pelvic and perineal pain: Secondary | ICD-10-CM | POA: Insufficient documentation

## 2015-09-30 LAB — URINALYSIS COMPLETE WITH MICROSCOPIC (ARMC ONLY)
BILIRUBIN URINE: NEGATIVE
Bacteria, UA: NONE SEEN
GLUCOSE, UA: NEGATIVE mg/dL
HGB URINE DIPSTICK: NEGATIVE
KETONES UR: NEGATIVE mg/dL
NITRITE: NEGATIVE
Protein, ur: 30 mg/dL — AB
SPECIFIC GRAVITY, URINE: 1.014 (ref 1.005–1.030)
pH: 6 (ref 5.0–8.0)

## 2015-09-30 LAB — POCT PREGNANCY, URINE: Preg Test, Ur: NEGATIVE

## 2015-09-30 NOTE — ED Notes (Signed)
Pt in with co vaginal pain x 3 days unsure of discharge, on abd pain or dysuria.

## 2015-11-11 ENCOUNTER — Emergency Department
Admission: EM | Admit: 2015-11-11 | Discharge: 2015-11-11 | Disposition: A | Discharge status: Home / Self Care | Payer: Medicaid Other | Attending: Emergency Medicine | Admitting: Emergency Medicine

## 2015-11-11 ENCOUNTER — Encounter: Payer: Self-pay | Admitting: *Deleted

## 2015-11-11 DIAGNOSIS — Z3202 Encounter for pregnancy test, result negative: Secondary | ICD-10-CM | POA: Diagnosis not present

## 2015-11-11 DIAGNOSIS — R1030 Lower abdominal pain, unspecified: Secondary | ICD-10-CM | POA: Diagnosis not present

## 2015-11-11 DIAGNOSIS — R109 Unspecified abdominal pain: Secondary | ICD-10-CM | POA: Diagnosis present

## 2015-11-11 DIAGNOSIS — Z791 Long term (current) use of non-steroidal anti-inflammatories (NSAID): Secondary | ICD-10-CM | POA: Diagnosis not present

## 2015-11-11 DIAGNOSIS — F17200 Nicotine dependence, unspecified, uncomplicated: Secondary | ICD-10-CM | POA: Diagnosis not present

## 2015-11-11 DIAGNOSIS — Z79899 Other long term (current) drug therapy: Secondary | ICD-10-CM | POA: Diagnosis not present

## 2015-11-11 LAB — BASIC METABOLIC PANEL
ANION GAP: 8 (ref 5–15)
BUN: 5 mg/dL — ABNORMAL LOW (ref 6–20)
CHLORIDE: 104 mmol/L (ref 101–111)
CO2: 27 mmol/L (ref 22–32)
Calcium: 9.4 mg/dL (ref 8.9–10.3)
Creatinine, Ser: 0.68 mg/dL (ref 0.44–1.00)
GFR calc Af Amer: >60 mL/min (ref >60)
GLUCOSE: 84 mg/dL (ref 65–99)
POTASSIUM: 3.5 mmol/L (ref 3.5–5.1)
Sodium: 139 mmol/L (ref 135–145)

## 2015-11-11 LAB — CBC WITH DIFFERENTIAL/PLATELET
BASOS ABS: 0.1 10*3/uL (ref 0–0.1)
Basophils Relative: 1 %
EOS PCT: 4 %
Eosinophils Absolute: 0.3 10*3/uL (ref 0–0.7)
HEMATOCRIT: 45.5 % (ref 35.0–47.0)
HEMOGLOBIN: 15.2 g/dL (ref 12.0–16.0)
LYMPHS ABS: 3.3 10*3/uL (ref 1.0–3.6)
LYMPHS PCT: 40 %
MCH: 32.2 pg (ref 26.0–34.0)
MCHC: 33.4 g/dL (ref 32.0–36.0)
MCV: 96.4 fL (ref 80.0–100.0)
Monocytes Absolute: 0.5 10*3/uL (ref 0.2–0.9)
Monocytes Relative: 6 %
NEUTROS ABS: 4 10*3/uL (ref 1.4–6.5)
NEUTROS PCT: 49 %
PLATELETS: 257 10*3/uL (ref 150–440)
RBC: 4.72 MIL/uL (ref 3.80–5.20)
RDW: 13.1 % (ref 11.5–14.5)
WBC: 8.2 10*3/uL (ref 3.6–11.0)

## 2015-11-11 LAB — URINALYSIS COMPLETE WITH MICROSCOPIC (ARMC ONLY)
BILIRUBIN URINE: NEGATIVE
Glucose, UA: NEGATIVE mg/dL
HGB URINE DIPSTICK: NEGATIVE
Ketones, ur: NEGATIVE mg/dL
LEUKOCYTES UA: NEGATIVE
Nitrite: NEGATIVE
PH: 6 (ref 5.0–8.0)
PROTEIN: NEGATIVE mg/dL
RBC / HPF: NONE SEEN RBC/hpf (ref 0–5)
Specific Gravity, Urine: 1.003 — ABNORMAL LOW (ref 1.005–1.030)

## 2015-11-11 LAB — POCT PREGNANCY, URINE: PREG TEST UR: NEGATIVE

## 2015-11-11 NOTE — ED Notes (Addendum)
Pt has low abd pain since yesterday.  Pt reports vaginal discharge and dysuria.  No back pain.  Pt has nausea.   Pt brought in via ems from home.

## 2015-11-11 NOTE — ED Notes (Signed)
Pt. Calling for ride in waitingroom.

## 2015-11-11 NOTE — ED Notes (Signed)
Pt unable to void at this time. 

## 2015-11-11 NOTE — Discharge Instructions (Signed)
Please seek medical attention for any high fevers, chest pain, shortness of breath, change in behavior, persistent vomiting, bloody stool or any other new or concerning symptoms. ° ° °Abdominal Pain, Adult °Many things can cause abdominal pain. Usually, abdominal pain is not caused by a disease and will improve without treatment. It can often be observed and treated at home. Your health care provider will do a physical exam and possibly order blood tests and X-rays to help determine the seriousness of your pain. However, in many cases, more time must pass before a clear cause of the pain can be found. Before that point, your health care provider may not know if you need more testing or further treatment. °HOME CARE INSTRUCTIONS °Monitor your abdominal pain for any changes. The following actions may help to alleviate any discomfort you are experiencing: °· Only take over-the-counter or prescription medicines as directed by your health care provider. °· Do not take laxatives unless directed to do so by your health care provider. °· Try a clear liquid diet (broth, tea, or water) as directed by your health care provider. Slowly move to a bland diet as tolerated. °SEEK MEDICAL CARE IF: °· You have unexplained abdominal pain. °· You have abdominal pain associated with nausea or diarrhea. °· You have pain when you urinate or have a bowel movement. °· You experience abdominal pain that wakes you in the night. °· You have abdominal pain that is worsened or improved by eating food. °· You have abdominal pain that is worsened with eating fatty foods. °· You have a fever. °SEEK IMMEDIATE MEDICAL CARE IF: °· Your pain does not go away within 2 hours. °· You keep throwing up (vomiting). °· Your pain is felt only in portions of the abdomen, such as the right side or the left lower portion of the abdomen. °· You pass bloody or black tarry stools. °MAKE SURE YOU: °· Understand these instructions. °· Will watch your  condition. °· Will get help right away if you are not doing well or get worse. °  °This information is not intended to replace advice given to you by your health care provider. Make sure you discuss any questions you have with your health care provider. °  °Document Released: 08/24/2005 Document Revised: 08/05/2015 Document Reviewed: 07/24/2013 °Elsevier Interactive Patient Education ©2016 [employer] ° °

## 2015-11-11 NOTE — ED Provider Notes (Signed)
[facility] Emergency Department Provider Note    ____________________________________________  Time seen: 1900  I have reviewed the triage vital signs and the nursing notes.   HISTORY  Chief Complaint Abdominal Pain   History limited by: Not Limited   HPI Misty Bradshaw is a 29 y.o. female who presented to the emergency department today with concerns for abdominal pain. The patient states that the pain started yesterday. She states that the pain is located in the lower abdomen. It has been constant. She states that sitting up makes it somewhat worse. It feels somewhat better standing up. She has not had any nausea or vomiting. She denies any change in defecation. She denied any dysuria to me. She denied any vaginal discharge to me however was noted on the nursing report. The patient denies any fevers.   Past Medical History  Diagnosis Date  . Depression   . Anxiety   . Bipolar 1 disorder [facility](HCC)     Patient Active Problem List   Diagnosis Date Noted  . Substance induced mood disorder (HCC) 09/11/2015  . Alcohol abuse 09/11/2015  . Bipolar disorder, current episode hypomanic (HCC) 09/04/2015  . Cannabis abuse 09/04/2015  . Homelessness 09/04/2015    No past surgical history on file.  Current Outpatient Rx  Name  Route  Sig  Dispense  Refill  . lithium 300 MG tablet   Oral   Take 1 tablet (300 mg total) by mouth 3 (three) times daily.   90 tablet   0   . naproxen (NAPROSYN) 500 MG tablet   Oral   Take 1 tablet (500 mg total) by mouth 2 (two) times daily with a meal.   20 tablet   0   . ranitidine (ZANTAC) 150 MG capsule   Oral   Take 1 capsule (150 mg total) by mouth 2 (two) times daily.   28 capsule   0   . sucralfate (CARAFATE) 1 G tablet   Oral   Take 1 tablet (1 g total) by mouth 4 (four) times daily.   120 tablet   1     Allergies Flagyl  No family history on file.  Social History Social History  Substance Use Topics   . Smoking status: Current Some Day Smoker  . Smokeless tobacco: None  . Alcohol Use: Yes    Review of Systems  Constitutional: Negative for fever. Cardiovascular: Negative for chest pain. Respiratory: Negative for shortness of breath. Gastrointestinal: Lower abdominal pain Genitourinary: Negative for dysuria. Musculoskeletal: Negative for back pain. Skin: Negative for rash. Neurological: Negative for headaches, focal weakness or numbness.  10-point ROS otherwise negative.  ____________________________________________   PHYSICAL EXAM:  VITAL SIGNS: ED Triage Vitals  Enc Vitals Group     BP 11/11/15 1751 119/77 mmHg     Pulse Rate 11/11/15 1751 58     Resp 11/11/15 1751 20     Temp 11/11/15 1751 98.3 F (36.8 C)     Temp Source 11/11/15 1751 Oral     SpO2 --      Weight 11/11/15 1751 200 lb (90.719 kg)     Height 11/11/15 1751 5\' 6"  (1.676 m)     Head Cir --      Peak Flow --      Pain Score 11/11/15 1753 7   Constitutional: Alert and oriented. Well appearing and in no distress. Eyes: Conjunctivae are normal. PERRL. Normal extraocular movements. ENT   Head: Normocephalic and atraumatic.   Nose: No congestion/rhinnorhea.  Mouth/Throat: Mucous membranes are moist.   Neck: No stridor. Hematological/Lymphatic/Immunilogical: No cervical lymphadenopathy. Cardiovascular: Normal rate, regular rhythm.  No murmurs, rubs, or gallops. Respiratory: Normal respiratory effort without tachypnea nor retractions. Breath sounds are clear and equal bilaterally. No wheezes/rales/rhonchi. Gastrointestinal: Soft and nontender. No distention. There is no CVA tenderness. Genitourinary: Declined Musculoskeletal: Normal range of motion in all extremities. No joint effusions.  No lower extremity tenderness nor edema. Neurologic:  Normal speech and language. No gross focal neurologic deficits are appreciated.  Skin:  Skin is warm, dry and intact. No rash noted. Psychiatric: Mood  and affect are normal. Speech and behavior are normal. Patient exhibits appropriate insight and judgment.  ____________________________________________    LABS (pertinent positives/negatives)  Labs Reviewed  BASIC METABOLIC PANEL - Abnormal; Notable for the following:    BUN 5 (*)    All other components within normal limits  URINALYSIS COMPLETEWITH MICROSCOPIC ([facility] ONLY) - Abnormal; Notable for the following:    Color, Urine STRAW (*)    APPearance CLEAR (*)    Specific Gravity, Urine 1.003 (*)    Bacteria, UA RARE (*)    Squamous Epithelial / LPF 0-5 (*)    All other components within normal limits  CBC WITH DIFFERENTIAL/PLATELET  POC URINE PREG, ED  POCT PREGNANCY, URINE     ____________________________________________   EKG  None  ____________________________________________    RADIOLOGY  None   ____________________________________________   PROCEDURES  Procedure(s) performed: None  Critical Care performed: No  ____________________________________________   INITIAL IMPRESSION / ASSESSMENT AND PLAN / ED COURSE  Pertinent labs & imaging results that were available during my care of the patient were reviewed by me and considered in my medical decision making (see chart for details).  Patient presented to the emergency department today with concerns for lower abdominal pain that started yesterday. My exam abdomen is soft and benign. Blood work and urine without concerning findings. Patient denied any vaginal discharge to me however nursing notes did mention a. I did offer to perform a pelvic exam for the patient however she declined. I explained that I would like to primarily check for infection or other concerning findings. She again declined. I did encourage patient to follow-up with her OB/GYN doctor who she sees at the health center.  ____________________________________________   FINAL CLINICAL IMPRESSION(S) / ED DIAGNOSES  Final diagnoses:   Lower abdominal pain     Phineas Semen, MD 11/11/15 2203

## 2015-12-01 DIAGNOSIS — R0602 Shortness of breath: Secondary | ICD-10-CM | POA: Insufficient documentation

## 2015-12-01 DIAGNOSIS — Y9289 Other specified places as the place of occurrence of the external cause: Secondary | ICD-10-CM | POA: Insufficient documentation

## 2015-12-01 DIAGNOSIS — R531 Weakness: Secondary | ICD-10-CM | POA: Insufficient documentation

## 2015-12-01 DIAGNOSIS — E049 Nontoxic goiter, unspecified: Secondary | ICD-10-CM | POA: Diagnosis not present

## 2015-12-01 DIAGNOSIS — Z3202 Encounter for pregnancy test, result negative: Secondary | ICD-10-CM | POA: Insufficient documentation

## 2015-12-01 DIAGNOSIS — W2209XA Striking against other stationary object, initial encounter: Secondary | ICD-10-CM | POA: Insufficient documentation

## 2015-12-01 DIAGNOSIS — Y998 Other external cause status: Secondary | ICD-10-CM | POA: Insufficient documentation

## 2015-12-01 DIAGNOSIS — R05 Cough: Secondary | ICD-10-CM | POA: Insufficient documentation

## 2015-12-01 DIAGNOSIS — S0990XA Unspecified injury of head, initial encounter: Secondary | ICD-10-CM | POA: Diagnosis not present

## 2015-12-01 DIAGNOSIS — E876 Hypokalemia: Secondary | ICD-10-CM | POA: Diagnosis not present

## 2015-12-01 DIAGNOSIS — Z79899 Other long term (current) drug therapy: Secondary | ICD-10-CM | POA: Insufficient documentation

## 2015-12-01 DIAGNOSIS — I951 Orthostatic hypotension: Secondary | ICD-10-CM | POA: Diagnosis not present

## 2015-12-01 DIAGNOSIS — Y9389 Activity, other specified: Secondary | ICD-10-CM | POA: Diagnosis not present

## 2015-12-01 DIAGNOSIS — R55 Syncope and collapse: Secondary | ICD-10-CM | POA: Diagnosis present

## 2015-12-01 DIAGNOSIS — Z791 Long term (current) use of non-steroidal anti-inflammatories (NSAID): Secondary | ICD-10-CM | POA: Insufficient documentation

## 2015-12-01 DIAGNOSIS — N3001 Acute cystitis with hematuria: Secondary | ICD-10-CM | POA: Diagnosis not present

## 2015-12-01 DIAGNOSIS — E860 Dehydration: Secondary | ICD-10-CM | POA: Insufficient documentation

## 2015-12-01 DIAGNOSIS — F17200 Nicotine dependence, unspecified, uncomplicated: Secondary | ICD-10-CM | POA: Diagnosis not present

## 2015-12-01 DIAGNOSIS — R0981 Nasal congestion: Secondary | ICD-10-CM | POA: Insufficient documentation

## 2015-12-01 LAB — URINALYSIS COMPLETE WITH MICROSCOPIC (ARMC ONLY)
BACTERIA UA: NONE SEEN
Bilirubin Urine: NEGATIVE
Glucose, UA: NEGATIVE mg/dL
Ketones, ur: NEGATIVE mg/dL
Nitrite: NEGATIVE
PROTEIN: 100 mg/dL — AB
SPECIFIC GRAVITY, URINE: 1.006 (ref 1.005–1.030)
pH: 6 (ref 5.0–8.0)

## 2015-12-01 LAB — BASIC METABOLIC PANEL
ANION GAP: 10 (ref 5–15)
BUN: 11 mg/dL (ref 6–20)
CHLORIDE: 97 mmol/L — AB (ref 101–111)
CO2: 27 mmol/L (ref 22–32)
Calcium: 8.2 mg/dL — ABNORMAL LOW (ref 8.9–10.3)
Creatinine, Ser: 1.27 mg/dL — ABNORMAL HIGH (ref 0.44–1.00)
GFR calc non Af Amer: 57 mL/min — ABNORMAL LOW (ref >60)
Glucose, Bld: 148 mg/dL — ABNORMAL HIGH (ref 65–99)
POTASSIUM: 3 mmol/L — AB (ref 3.5–5.1)
SODIUM: 134 mmol/L — AB (ref 135–145)

## 2015-12-01 LAB — CBC
HEMATOCRIT: 41.6 % (ref 35.0–47.0)
HEMOGLOBIN: 14.2 g/dL (ref 12.0–16.0)
MCH: 32.5 pg (ref 26.0–34.0)
MCHC: 34.1 g/dL (ref 32.0–36.0)
MCV: 95.4 fL (ref 80.0–100.0)
Platelets: 182 10*3/uL (ref 150–440)
RBC: 4.36 MIL/uL (ref 3.80–5.20)
RDW: 12.9 % (ref 11.5–14.5)
WBC: 23.6 10*3/uL — AB (ref 3.6–11.0)

## 2015-12-01 LAB — POCT PREGNANCY, URINE: Preg Test, Ur: NEGATIVE

## 2015-12-01 NOTE — ED Notes (Signed)
Pt arrived to Ed with family with c/o syncopal episode while sitting at the table tonight. Pt reports feeling of malaise with cough and congestion since Sunday. Pt reports not eating as normal due to "not feeling well". Pt also c/o headache.

## 2015-12-02 ENCOUNTER — Emergency Department: Payer: Medicaid Other

## 2015-12-02 ENCOUNTER — Emergency Department
Admission: EM | Admit: 2015-12-02 | Discharge: 2015-12-02 | Disposition: A | Discharge status: Home / Self Care | Payer: Medicaid Other | Attending: Emergency Medicine | Admitting: Emergency Medicine

## 2015-12-02 DIAGNOSIS — I951 Orthostatic hypotension: Secondary | ICD-10-CM

## 2015-12-02 DIAGNOSIS — N3001 Acute cystitis with hematuria: Secondary | ICD-10-CM

## 2015-12-02 DIAGNOSIS — E860 Dehydration: Secondary | ICD-10-CM

## 2015-12-02 DIAGNOSIS — R55 Syncope and collapse: Secondary | ICD-10-CM

## 2015-12-02 DIAGNOSIS — E876 Hypokalemia: Secondary | ICD-10-CM

## 2015-12-02 LAB — TSH: TSH: 1.699 u[IU]/mL (ref 0.350–4.500)

## 2015-12-02 MED ORDER — CEPHALEXIN 500 MG PO CAPS: 500.0000 mg | ORAL_CAPSULE | Freq: Four times a day (QID) | ORAL | Status: AC

## 2015-12-02 MED ORDER — SODIUM CHLORIDE 0.9 % IV BOLUS (SEPSIS)
1000.0000 mL | Freq: Once | INTRAVENOUS | Status: AC
  Administered 2015-12-02: 1000 mL via INTRAVENOUS

## 2015-12-02 MED ORDER — POTASSIUM CHLORIDE CRYS ER 20 MEQ PO TBCR
40.0000 meq | EXTENDED_RELEASE_TABLET | Freq: Once | ORAL | Status: AC
  Administered 2015-12-02: 40 meq via ORAL
  Filled 2015-12-02: qty 2

## 2015-12-02 MED ORDER — CEPHALEXIN 500 MG PO CAPS
500.0000 mg | ORAL_CAPSULE | Freq: Once | ORAL | Status: AC
  Administered 2015-12-02: 500 mg via ORAL
  Filled 2015-12-02: qty 1

## 2015-12-02 NOTE — Discharge Instructions (Signed)
Dehydration, Adult Dehydration is a condition in which you do not have enough fluid or water in your body. It happens when you take in less fluid than you lose. Vital organs such as the kidneys, brain, and heart cannot function without a proper amount of fluids. Any loss of fluids from the body can cause dehydration.  Dehydration can range from mild to severe. This condition should be treated right away to help prevent it from becoming severe. CAUSES  This condition may be caused by:  Vomiting.  Diarrhea.  Excessive sweating, such as when exercising in hot or humid weather.  Not drinking enough fluid during strenuous exercise or during an illness.  Excessive urine output.  Fever.  Certain medicines. RISK FACTORS This condition is more likely to develop in:  People who are taking certain medicines that cause the body to lose excess fluid (diuretics).   People who have a chronic illness, such as diabetes, that may increase urination.  Older adults.   People who live at high altitudes.   People who participate in endurance sports.  SYMPTOMS  Mild Dehydration  Thirst.  Dry lips.  Slightly dry mouth.  Dry, warm skin. Moderate Dehydration  Very dry mouth.   Muscle cramps.   Dark urine and decreased urine production.   Decreased tear production.   Headache.   Light-headedness, especially when you stand up from a sitting position.  Severe Dehydration  Changes in skin.   Cold and clammy skin.   Skin does not spring back quickly when lightly pinched and released.   Changes in body fluids.   Extreme thirst.   No tears.   Not able to sweat when body temperature is high, such as in hot weather.   Minimal urine production.   Changes in vital signs.   Rapid, weak pulse (more than 100 beats per minute when you are sitting still).   Rapid breathing.   Low blood pressure.   Other changes.   Sunken eyes.   Cold hands and feet.    Confusion.  Lethargy and difficulty being awakened.  Fainting (syncope).   Short-term weight loss.   Unconsciousness. DIAGNOSIS  This condition may be diagnosed based on your symptoms. You may also have tests to determine how severe your dehydration is. These tests may include:   Urine tests.   Blood tests.  TREATMENT  Treatment for this condition depends on the severity. Mild or moderate dehydration can often be treated at home. Treatment should be started right away. Do not wait until dehydration becomes severe. Severe dehydration needs to be treated at the hospital. Treatment for Mild Dehydration  Drinking plenty of water to replace the fluid you have lost.   Replacing minerals in your blood (electrolytes) that you may have lost.  Treatment for Moderate Dehydration  Consuming oral rehydration solution (ORS). Treatment for Severe Dehydration  Receiving fluid through an IV tube.   Receiving electrolyte solution through a feeding tube that is passed through your nose and into your stomach (nasogastric tube or NG tube).  Correcting any abnormalities in electrolytes. HOME CARE INSTRUCTIONS   Drink enough fluid to keep your urine clear or pale yellow.   Drink water or fluid slowly by taking small sips. You can also try sucking on ice cubes.  Have food or beverages that contain electrolytes. Examples include bananas and sports drinks.  Take over-the-counter and prescription medicines only as told by your health care provider.   Prepare ORS according to the manufacturer's instructions. Take sips  of ORS every 5 minutes until your urine returns to normal.  If you have vomiting or diarrhea, continue to try to drink water, ORS, or both.   If you have diarrhea, avoid:   Beverages that contain caffeine.   Fruit juice.   Milk.   Carbonated soft drinks.  Do not take salt tablets. This can lead to the condition of having too much sodium in your body  (hypernatremia).  SEEK MEDICAL CARE IF:  You cannot eat or drink without vomiting.  You have had moderate diarrhea during a period of more than 24 hours.  You have a fever. SEEK IMMEDIATE MEDICAL CARE IF:   You have extreme thirst.  You have severe diarrhea.  You have not urinated in 6-8 hours, or you have urinated only a small amount of very dark urine.  You have shriveled skin.  You are dizzy, confused, or both.   This information is not intended to replace advice given to you by your health care provider. Make sure you discuss any questions you have with your health care provider.   Document Released: 11/14/2005 Document Revised: 08/05/2015 Document Reviewed: 04/01/2015 Elsevier Interactive Patient Education 2016 [employer]  Hypokalemia Hypokalemia means that the amount of potassium in the blood is lower than normal.Potassium is a chemical, called an electrolyte, that helps regulate the amount of fluid in the body. It also stimulates muscle contraction and helps nerves function properly.Most of the body's potassium is inside of cells, and only a very small amount is in the blood. Because the amount in the blood is so small, minor changes can be life-threatening. CAUSES  Antibiotics.  Diarrhea or vomiting.  Using laxatives too much, which can cause diarrhea.  Chronic kidney disease.  Water pills (diuretics).  Eating disorders (bulimia).  Low magnesium level.  Sweating a lot. SIGNS AND SYMPTOMS  Weakness.  Constipation.  Fatigue.  Muscle cramps.  Mental confusion.  Skipped heartbeats or irregular heartbeat (palpitations).  Tingling or numbness. DIAGNOSIS  Your health care provider can diagnose hypokalemia with blood tests. In addition to checking your potassium level, your health care provider may also check other lab tests. TREATMENT Hypokalemia can be treated with potassium supplements taken by mouth or adjustments in your current medicines. If  your potassium level is very low, you may need to get potassium through a vein (IV) and be monitored in the hospital. A diet high in potassium is also helpful. Foods high in potassium are:  Nuts, such as peanuts and pistachios.  Seeds, such as sunflower seeds and pumpkin seeds.  Peas, lentils, and lima beans.  Whole grain and bran cereals and breads.  Fresh fruit and vegetables, such as apricots, avocado, bananas, cantaloupe, kiwi, oranges, tomatoes, asparagus, and potatoes.  Orange and tomato juices.  Red meats.  Fruit yogurt. HOME CARE INSTRUCTIONS  Take all medicines as prescribed by your health care provider.  Maintain a healthy diet by including nutritious food, such as fruits, vegetables, nuts, whole grains, and lean meats.  If you are taking a laxative, be sure to follow the directions on the label. SEEK MEDICAL CARE IF:  Your weakness gets worse.  You feel your heart pounding or racing.  You are vomiting or having diarrhea.  You are diabetic and having trouble keeping your blood glucose in the normal range. SEEK IMMEDIATE MEDICAL CARE IF:  You have chest pain, shortness of breath, or dizziness.  You are vomiting or having diarrhea for more than 2 days.  You faint. MAKE SURE  YOU:   Understand these instructions.  Will watch your condition.  Will get help right away if you are not doing well or get worse.   This information is not intended to replace advice given to you by your health care provider. Make sure you discuss any questions you have with your health care provider.   Document Released: 11/14/2005 Document Revised: 12/05/2014 Document Reviewed: 05/17/2013 Elsevier Interactive Patient Education 2016 Elsevier Inc.  Orthostatic Hypotension Orthostatic hypotension is a sudden drop in blood pressure. It happens when you quickly stand up from a seated or lying position. You may feel dizzy or light-headed. This can last for just a few seconds or for up  to a few minutes. It is usually not a serious problem. However, if this happens frequently or gets worse, it can be a sign of something more serious. CAUSES  Different things can cause orthostatic hypotension, including:   Loss of body fluids (dehydration).  Medicines that lower blood pressure.  Sudden changes in posture, such as standing up quickly after you have been sitting or lying down.  Taking too much of your medicine. SIGNS AND SYMPTOMS   Light-headedness or dizziness.   Fainting or near-fainting.   A fast heart rate.   Weakness.   Feeling tired (fatigue).  DIAGNOSIS  Your health care provider may do several things to help diagnose your condition and identify the cause. These may include:   Taking a medical history and doing a physical exam.  Checking your blood pressure. Your health care provider will check your blood pressure when you are:  Lying down.  Sitting.  Standing.  Using tilt table testing. In this test, you lie down on a table that moves from a lying position to a standing position. You will be strapped onto the table. This test monitors your blood pressure and heart rate when you are in different positions. TREATMENT  Treatment will vary depending on the cause. Possible treatments include:   Changing the dosage of your medicines.  Wearing compression stockings on your lower legs.  Standing up slowly after sitting or lying down.  Eating more salt.  Eating frequent, small meals.  In some cases, getting IV fluids.  Taking medicine to enhance fluid retention. HOME CARE INSTRUCTIONS  Only take over-the-counter or prescription medicines as directed by your health care provider.  Follow your health care provider's instructions for changing the dosage of your current medicines.  Do not stop or adjust your medicine on your own.  Stand up slowly after sitting or lying down. This allows your body to adjust to the different position.  Wear  compression stockings as directed.  Eat extra salt as directed.  Do not add extra salt to your diet unless directed to by your health care provider.  Eat frequent, small meals.  Avoid standing suddenly after eating.  Avoid hot showers or excessive heat as directed by your health care provider.  Keep all follow-up appointments. SEEK MEDICAL CARE IF:  You continue to feel dizzy or light-headed after standing.  You feel groggy or confused.  You feel cold, clammy, or sick to your stomach (nauseous).  You have blurred vision.  You feel short of breath. SEEK IMMEDIATE MEDICAL CARE IF:   You faint after standing.  You have chest pain.  You have difficulty breathing.   You lose feeling or movement in your arms or legs.   You have slurred speech or difficulty talking, or you are unable to talk.  MAKE SURE YOU:  Understand these instructions.  Will watch your condition.  Will get help right away if you are not doing well or get worse.   This information is not intended to replace advice given to you by your health care provider. Make sure you discuss any questions you have with your health care provider.   Document Released: 11/04/2002 Document Revised: 11/19/2013 Document Reviewed: 09/06/2013 Elsevier Interactive Patient Education 2016 [employer].  Syncope Syncope is a medical term for fainting or passing out. This means you lose consciousness and drop to the ground. People are generally unconscious for less than 5 minutes. You may have some muscle twitches for up to 15 seconds before waking up and returning to normal. Syncope occurs more often in older adults, but it can happen to anyone. While most causes of syncope are not dangerous, syncope can be a sign of a serious medical problem. It is important to seek medical care.  CAUSES  Syncope is caused by a sudden drop in blood flow to the brain. The specific cause is often not determined. Factors that can bring on  syncope include:  Taking medicines that lower blood pressure.  Sudden changes in posture, such as standing up quickly.  Taking more medicine than prescribed.  Standing in one place for too long.  Seizure disorders.  Dehydration and excessive exposure to heat.  Low blood sugar (hypoglycemia).  Straining to have a bowel movement.  Heart disease, irregular heartbeat, or other circulatory problems.  Fear, emotional distress, seeing blood, or severe pain. SYMPTOMS  Right before fainting, you may:  Feel dizzy or light-headed.  Feel nauseous.  See all white or all black in your field of vision.  Have cold, clammy skin. DIAGNOSIS  Your health care provider will ask about your symptoms, perform a physical exam, and perform an electrocardiogram (ECG) to record the electrical activity of your heart. Your health care provider may also perform other heart or blood tests to determine the cause of your syncope which may include:  Transthoracic echocardiogram (TTE). During echocardiography, sound waves are used to evaluate how blood flows through your heart.  Transesophageal echocardiogram (TEE).  Cardiac monitoring. This allows your health care provider to monitor your heart rate and rhythm in real time.  Holter monitor. This is a portable device that records your heartbeat and can help diagnose heart arrhythmias. It allows your health care provider to track your heart activity for several days, if needed.  Stress tests by exercise or by giving medicine that makes the heart beat faster. TREATMENT  In most cases, no treatment is needed. Depending on the cause of your syncope, your health care provider may recommend changing or stopping some of your medicines. HOME CARE INSTRUCTIONS  Have someone stay with you until you feel stable.  Do not drive, use machinery, or play sports until your health care provider says it is okay.  Keep all follow-up appointments as directed by your  health care provider.  Lie down right away if you start feeling like you might faint. Breathe deeply and steadily. Wait until all the symptoms have passed.  Drink enough fluids to keep your urine clear or pale yellow.  If you are taking blood pressure or heart medicine, get up slowly and take several minutes to sit and then stand. This can reduce dizziness. SEEK IMMEDIATE MEDICAL CARE IF:   You have a severe headache.  You have unusual pain in the chest, abdomen, or back.  You are bleeding from your mouth or rectum,  or you have black or tarry stool.  You have an irregular or very fast heartbeat.  You have pain with breathing.  You have repeated fainting or seizure-like jerking during an episode.  You faint when sitting or lying down.  You have confusion.  You have trouble walking.  You have severe weakness.  You have vision problems. If you fainted, call your local emergency services (911 in [place] Do not drive yourself to the hospital.    This information is not intended to replace advice given to you by your health care provider. Make sure you discuss any questions you have with your health care provider.   Document Released: 11/14/2005 Document Revised: 03/31/2015 Document Reviewed: 01/13/2012 Elsevier Interactive Patient Education 2016 Elsevier Inc.  Urinary Tract Infection A urinary tract infection (UTI) can occur any place along the urinary tract. The tract includes the kidneys, ureters, bladder, and urethra. A type of germ called bacteria often causes a UTI. UTIs are often helped with antibiotic medicine.  HOME CARE   If given, take antibiotics as told by your doctor. Finish them even if you start to feel better.  Drink enough fluids to keep your pee (urine) clear or pale yellow.  Avoid tea, drinks with caffeine, and bubbly (carbonated) drinks.  Pee often. Avoid holding your pee in for a long time.  Pee before and after having sex (intercourse).  Wipe from  front to back after you poop (bowel movement) if you are a woman. Use each tissue only once. GET HELP RIGHT AWAY IF:   You have back pain.  You have lower belly (abdominal) pain.  You have chills.  You feel sick to your stomach (nauseous).  You throw up (vomit).  Your burning or discomfort with peeing does not go away.  You have a fever.  Your symptoms are not better in 3 days. MAKE SURE YOU:   Understand these instructions.  Will watch your condition.  Will get help right away if you are not doing well or get worse.   This information is not intended to replace advice given to you by your health care provider. Make sure you discuss any questions you have with your health care provider.   Document Released: 05/02/2008 Document Revised: 12/05/2014 Document Reviewed: 06/14/2012 Elsevier Interactive Patient Education [employer] Elsevier Inc.

## 2015-12-02 NOTE — ED Provider Notes (Signed)
[facility] Emergency Department Provider Note  ____________________________________________  Time seen: Approximately  126 AM  I have reviewed the triage vital signs and the nursing notes.   HISTORY  Chief Complaint Near Syncope    HPI Misty Bradshaw is a 30 y.o. female who comes into the hospital today with a syncopal episode. The patient has not eaten a lot of food she reports since Saturday. She reports that she has felt this morning and noodles this afternoon. She was sitting in her friend's kitchen when she started feeling hot. She got up to walk to the door and reports that she passed out when she got to the door. The patient reports that she was not unconscious for a long time but she did hit her head. Prior to passing out the patient did have a little shortness of breath. The patient has had some cough and runny nose with some sneezing. She also reports that her dad has been sick. The patient has not had any alcohol since Saturday but reports that she has been drinking other fluids including soda, tea, lemonade and water. Patient has never fainted before.The patient is here for evaluation.   Past Medical History  Diagnosis Date  . Depression   . Anxiety   . Bipolar 1 disorder [facility](HCC)     Patient Active Problem List   Diagnosis Date Noted  . Substance induced mood disorder (HCC) 09/11/2015  . Alcohol abuse 09/11/2015  . Bipolar disorder, current episode hypomanic (HCC) 09/04/2015  . Cannabis abuse 09/04/2015  . Homelessness 09/04/2015    History reviewed. No pertinent past surgical history.  Current Outpatient Rx  Name  Route  Sig  Dispense  Refill  . cephALEXin (KEFLEX) 500 MG capsule   Oral   Take 1 capsule (500 mg total) by mouth 4 (four) times daily.   40 capsule   0   . lithium 300 MG tablet   Oral   Take 1 tablet (300 mg total) by mouth 3 (three) times daily.   90 tablet   0   . naproxen (NAPROSYN) 500 MG tablet   Oral   Take 1  tablet (500 mg total) by mouth 2 (two) times daily with a meal.   20 tablet   0   . ranitidine (ZANTAC) 150 MG capsule   Oral   Take 1 capsule (150 mg total) by mouth 2 (two) times daily.   28 capsule   0   . sucralfate (CARAFATE) 1 G tablet   Oral   Take 1 tablet (1 g total) by mouth 4 (four) times daily.   120 tablet   1     Allergies Flagyl  History reviewed. No pertinent family history.  Social History Social History  Substance Use Topics  . Smoking status: Current Some Day Smoker  . Smokeless tobacco: None  . Alcohol Use: Yes    Review of Systems Constitutional: No fever/chills Eyes: No visual changes. ENT: Runny nose. Cardiovascular: Denies chest pain. Respiratory: Cough and congestion with mild shortness of breath. Gastrointestinal: No abdominal pain.  No nausea, no vomiting.  No diarrhea.  No constipation. Genitourinary: Negative for dysuria. Musculoskeletal: Negative for back pain. Skin: Negative for rash. Neurological: Syncope and generalized weakness  10-point ROS otherwise negative.  ____________________________________________   PHYSICAL EXAM:  VITAL SIGNS: ED Triage Vitals  Enc Vitals Group     BP 12/01/15 2210 102/62 mmHg     Pulse Rate 12/01/15 2210 115     Resp 12/01/15  2210 18     Temp 12/01/15 2210 99.1 F (37.3 C)     Temp Source 12/01/15 2210 Oral     SpO2 12/01/15 2210 97 %     Weight 12/01/15 2210 180 lb (81.647 kg)     Height 12/01/15 2210 5\' 6"  (1.676 m)     Head Cir --      Peak Flow --      Pain Score 12/01/15 2223 10     Pain Loc --      Pain Edu? --      Excl. in GC? --     Constitutional: Alert and oriented. Well appearing and in mild distress. Eyes: Conjunctivae are normal. PERRL. EOMI. Head: Atraumatic. Nose: No congestion/rhinnorhea. Mouth/Throat: Mucous membranes are moist.  Oropharynx non-erythematous. 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 Grossly normal  heart sounds.  Good peripheral circulation. Respiratory: Normal respiratory effort.  No retractions. Lungs CTAB. Gastrointestinal: Soft and nontender. No distention. Positive bowel sounds Musculoskeletal: No lower extremity tenderness nor edema.   Neurologic:  Normal speech and language. No gross focal neurologic deficits are appreciated.  Skin:  Skin is warm, dry and intact.  Psychiatric: Mood and affect are normal.   ____________________________________________   LABS (all labs ordered are listed, but only abnormal results are displayed)  Labs Reviewed  BASIC METABOLIC PANEL - Abnormal; Notable for the following:    Sodium 134 (*)    Potassium 3.0 (*)    Chloride 97 (*)    Glucose, Bld 148 (*)    Creatinine, Ser 1.27 (*)    Calcium 8.2 (*)    GFR calc non Af Amer 57 (*)    All other components within normal limits  CBC - Abnormal; Notable for the following:    WBC 23.6 (*)    All other components within normal limits  URINALYSIS COMPLETEWITH MICROSCOPIC ([facility] ONLY) - Abnormal; Notable for the following:    Color, Urine YELLOW (*)    APPearance CLOUDY (*)    Hgb urine dipstick 2+ (*)    Protein, ur 100 (*)    Leukocytes, UA 3+ (*)    Squamous Epithelial / LPF 6-30 (*)    All other components within normal limits  URINE CULTURE  TSH  POC URINE PREG, ED  POCT PREGNANCY, URINE   ____________________________________________  EKG  ED ECG REPORT I, Rebecka Apley, the attending physician, personally viewed and interpreted this ECG.   Date: 12/01/2015  EKG Time: 2221  Rate: 102  Rhythm: sinus tachycardia  Axis: Normal  Intervals:none  ST&T Change: None  ____________________________________________  RADIOLOGY  CT head: Unremarkable non con CT of the head ____________________________________________   PROCEDURES  Procedure(s) performed: None  Critical Care performed: No  ____________________________________________   INITIAL IMPRESSION / ASSESSMENT  AND PLAN / ED COURSE  Pertinent labs & imaging results that were available during my care of the patient were reviewed by me and considered in my medical decision making (see chart for details).  This is a 30 year old female who comes into the hospital today with a syncopal episode. The patient reports that she has not been eating much and has not been feeling well. The patient did have some shortness of breath and reports that she did hit her head. I will do a CT scan to evaluate her head and do some orthostatic vital signs. I will then give the patient some hydration and treat her urinary tract infection. I will reassess  the patient once I received her results and she's receive her medication.  The patient did receive some normal saline as well as some potassium and Keflex. I will discharge the patient to home to have [facility]. At this time the patient is not having any other complaints. She'll be discharged and she has been encouraged to take by mouth fluids to help with her dehydration. ____________________________________________   FINAL CLINICAL IMPRESSION(S) / ED DIAGNOSES  Final diagnoses:  Acute cystitis with hematuria  Syncope, unspecified syncope type  Dehydration  Hypokalemia  Orthostatic hypotension      Rebecka Apley, MD 12/02/15 0330

## 2015-12-03 LAB — URINE CULTURE

## 2016-06-01 DIAGNOSIS — Z79899 Other long term (current) drug therapy: Secondary | ICD-10-CM | POA: Insufficient documentation

## 2016-06-01 DIAGNOSIS — F17200 Nicotine dependence, unspecified, uncomplicated: Secondary | ICD-10-CM | POA: Insufficient documentation

## 2016-06-01 DIAGNOSIS — L02415 Cutaneous abscess of right lower limb: Secondary | ICD-10-CM | POA: Insufficient documentation

## 2016-06-01 DIAGNOSIS — Z791 Long term (current) use of non-steroidal anti-inflammatories (NSAID): Secondary | ICD-10-CM | POA: Insufficient documentation

## 2016-06-01 DIAGNOSIS — F310 Bipolar disorder, current episode hypomanic: Secondary | ICD-10-CM | POA: Insufficient documentation

## 2016-06-01 NOTE — ED Notes (Signed)
Pt in with co abscess to right inner thigh x 1 week, has drained since.

## 2016-06-02 ENCOUNTER — Emergency Department
Admission: EM | Admit: 2016-06-02 | Discharge: 2016-06-02 | Disposition: A | Discharge status: Home / Self Care | Payer: Medicaid Other | Attending: Emergency Medicine | Admitting: Emergency Medicine

## 2016-06-02 DIAGNOSIS — L02415 Cutaneous abscess of right lower limb: Secondary | ICD-10-CM

## 2016-06-02 MED ORDER — DOCUSATE SODIUM 100 MG PO CAPS: ORAL_CAPSULE | ORAL | Status: DC

## 2016-06-02 MED ORDER — LIDOCAINE HCL (PF) 1 % IJ SOLN
10.0000 mL | Freq: Once | INTRAMUSCULAR | Status: AC
  Administered 2016-06-02: 10 mL
  Filled 2016-06-02: qty 10

## 2016-06-02 MED ORDER — HYDROCODONE-ACETAMINOPHEN 5-325 MG PO TABS: 1.0000 | ORAL_TABLET | ORAL | Status: DC | PRN

## 2016-06-02 MED ORDER — OXYCODONE-ACETAMINOPHEN 5-325 MG PO TABS
2.0000 | ORAL_TABLET | Freq: Once | ORAL | Status: AC
  Administered 2016-06-02: 2 via ORAL
  Filled 2016-06-02: qty 2

## 2016-06-02 NOTE — Discharge Instructions (Signed)
You have been seen in the Emergency Department (ED) today for an abscess.  This was drained in the ED.  Please follow up with your doctor or in the ED in 24-48 hours for recheck of your wound and to have the packing removed.  If it falls out on its own, do not try to put the packing back in.  Read through the additional discharge instructions included below regarding wound care recommendations.  Keep the wound clean and dry, though you may wash as you would normally.  Change the dressing twice daily.  Call your doctor sooner or return to the ED if you develop worsening signs of infection such as: increased redness, increased pain, pus, or fever.   Abscess An abscess is an infected area that contains a collection of pus and debris.It can occur in almost any part of the body. An abscess is also known as a furuncle or boil. CAUSES  An abscess occurs when tissue gets infected. This can occur from blockage of oil or sweat glands, infection of hair follicles, or a minor injury to the skin. As the body tries to fight the infection, pus collects in the area and creates pressure under the skin. This pressure causes pain. People with weakened immune systems have difficulty fighting infections and get certain abscesses more often.  SYMPTOMS Usually an abscess develops on the skin and becomes a painful mass that is red, warm, and tender. If the abscess forms under the skin, you may feel a moveable soft area under the skin. Some abscesses break open (rupture) on their own, but most will continue to get worse without care. The infection can spread deeper into the body and eventually into the bloodstream, causing you to feel ill.  DIAGNOSIS  Your caregiver will take your medical history and perform a physical exam. A sample of fluid may also be taken from the abscess to determine what is causing your infection. TREATMENT  Your caregiver may prescribe antibiotic medicines to fight the infection. However, taking  antibiotics alone usually does not cure an abscess. Your caregiver may need to make a small cut (incision) in the abscess to drain the pus. In some cases, gauze is packed into the abscess to reduce pain and to continue draining the area. HOME CARE INSTRUCTIONS   Only take over-the-counter or prescription medicines for pain, discomfort, or fever as directed by your caregiver.  If you were prescribed antibiotics, take them as directed. Finish them even if you start to feel better.  If gauze is used, follow your caregiver's directions for changing the gauze.  To avoid spreading the infection:  Keep your draining abscess covered with a bandage.  Wash your hands well.  Do not share personal care items, towels, or whirlpools with others.  Avoid skin contact with others.  Keep your skin and clothes clean around the abscess.  Keep all follow-up appointments as directed by your caregiver. SEEK MEDICAL CARE IF:   You have increased pain, swelling, redness, fluid drainage, or bleeding.  You have muscle aches, chills, or a general ill feeling.  You have a fever. MAKE SURE YOU:   Understand these instructions.  Will watch your condition.  Will get help right away if you are not doing well or get worse. Document Released: 08/24/2005 Document Revised: 05/15/2012 Document Reviewed: 01/27/2012 [facility] Patient Information 2015 [place]. This information is not intended to replace advice given to you by your health care provider. Make sure you discuss any questions you have with  your health care provider.  Abscess Care After An abscess (also called a boil or furuncle) is an infected area that contains a collection of pus. Signs and symptoms of an abscess include pain, tenderness, redness, or hardness, or you may feel a moveable soft area under your skin. An abscess can occur anywhere in the body. The infection may spread to surrounding tissues causing cellulitis. A cut (incision) by the  surgeon was made over your abscess and the pus was drained out. Gauze may have been packed into the space to provide a drain that will allow the cavity to heal from the inside outwards. The boil may be painful for 5 to 7 days. Most people with a boil do not have high fevers. Your abscess, if seen early, may not have localized, and may not have been lanced. If not, another appointment may be required for this if it does not get better on its own or with medications. HOME CARE INSTRUCTIONS   Only take over-the-counter or prescription medicines for pain, discomfort, or fever as directed by your caregiver.  When you bathe, soak and then remove gauze or iodoform packs at least daily or as directed by your caregiver. You may then wash the wound gently with mild soapy water. Repack with gauze or do as your caregiver directs. SEEK IMMEDIATE MEDICAL CARE IF:   You develop increased pain, swelling, redness, drainage, or bleeding in the wound site.  You develop signs of generalized infection including muscle aches, chills, fever, or a general ill feeling.  An oral temperature above 102 F (38.9 C) develops, not controlled by medication. See your caregiver for a recheck if you develop any of the symptoms described above. If medications (antibiotics) were prescribed, take them as directed. Document Released: 06/02/2005 Document Revised: 02/06/2012 Document Reviewed: 01/28/2008 [facility] Patient Information 2015 [place], [place]. This information is not intended to replace advice given to you by your health care provider. Make sure you discuss any questions you have with your health care provider.    Incision and Drainage Incision and drainage is a procedure in which a sac-like structure (cystic structure) is opened and drained. The area to be drained usually contains material such as pus, fluid, or blood.  LET YOUR CAREGIVER KNOW ABOUT:   Allergies to medicine.  Medicines taken, including vitamins, herbs,  eyedrops, over-the-counter medicines, and creams.  Use of steroids (by mouth or creams).  Previous problems with anesthetics or numbing medicines.  History of bleeding problems or blood clots.  Previous surgery.  Other health problems, including diabetes and kidney problems.  Possibility of pregnancy, if this applies. RISKS AND COMPLICATIONS  Pain.  Bleeding.  Scarring.  Infection. BEFORE THE PROCEDURE  You may need to have an ultrasound or other imaging tests to see how large or deep your cystic structure is. Blood tests may also be used to determine if you have an infection or how severe the infection is. You may need to have a tetanus shot. PROCEDURE  The affected area is cleaned with a cleaning fluid. The cyst area will then be numbed with a medicine (local anesthetic). A small incision will be made in the cystic structure. A syringe or catheter may be used to drain the contents of the cystic structure, or the contents may be squeezed out. The area will then be flushed with a cleansing solution. After cleansing the area, it is often gently packed with a gauze or another wound dressing. Once it is packed, it will be covered with  gauze and tape or some other type of wound dressing. AFTER THE PROCEDURE   Often, you will be allowed to go home right after the procedure.  You may be given antibiotic medicine to prevent or heal an infection.  If the area was packed with gauze or some other wound dressing, you will likely need to come back in 1 to 2 days to get it removed.  The area should heal in about 14 days.   This information is not intended to replace advice given to you by your health care provider. Make sure you discuss any questions you have with your health care provider.   Document Released: 05/10/2001 Document Revised: 05/15/2012 Document Reviewed: 01/09/2012 Elsevier Interactive Patient Education [employer]2016 [employer] Inc.

## 2016-06-02 NOTE — ED Provider Notes (Signed)
[facility] Emergency Department Provider Note  ____________________________________________  Time seen: Approximately 12:32 AM  I have reviewed the triage vital signs and the nursing notes.   HISTORY  Chief Complaint Abscess    HPI Misty Bradshaw is a 30 y.o. female who presents for gradual onset but worsening abscess to her right proximal inner thigh.  She states that she gets "hair bumps" sometimes but that this was much worse.  She has never had an abscess that requires drainage before.  She states that it has been present for about a week but getting bigger and worse.  The pain is sharp and aching and severe and worsened with walking or pressure. Nothing is making it better.  She denies fever/chills, chest pain, shortness of breath, nausea, vomiting, diarrhea, dysuria.  There is no redness or swelling spreading away from the abscess.  She states that earlier today at open spontaneously and drained some pus but then it stopped.   Past Medical History  Diagnosis Date  . Depression   . Anxiety   . Bipolar 1 disorder [facility](HCC)     Patient Active Problem List   Diagnosis Date Noted  . Substance induced mood disorder (HCC) 09/11/2015  . Alcohol abuse 09/11/2015  . Bipolar disorder, current episode hypomanic (HCC) 09/04/2015  . Cannabis abuse 09/04/2015  . Homelessness 09/04/2015    No past surgical history on file.  Current Outpatient Rx  Name  Route  Sig  Dispense  Refill  . docusate sodium (COLACE) 100 MG capsule      Take 1 tablet once or twice daily as needed for constipation while taking narcotic pain medicine   30 capsule   0   . HYDROcodone-acetaminophen (NORCO/VICODIN) 5-325 MG tablet   Oral   Take 1-2 tablets by mouth every 4 (four) hours as needed for moderate pain.   15 tablet   0   . lithium 300 MG tablet   Oral   Take 1 tablet (300 mg total) by mouth 3 (three) times daily.   90 tablet   0   . naproxen (NAPROSYN) 500 MG  tablet   Oral   Take 1 tablet (500 mg total) by mouth 2 (two) times daily with a meal.   20 tablet   0   . ranitidine (ZANTAC) 150 MG capsule   Oral   Take 1 capsule (150 mg total) by mouth 2 (two) times daily.   28 capsule   0   . sucralfate (CARAFATE) 1 G tablet   Oral   Take 1 tablet (1 g total) by mouth 4 (four) times daily.   120 tablet   1     Allergies Flagyl  No family history on file.  Social History Social History  Substance Use Topics  . Smoking status: Current Some Day Smoker  . Smokeless tobacco: Not on file  . Alcohol Use: Yes    Review of Systems Constitutional: No fever/chills Eyes: No visual changes. ENT: No sore throat. Cardiovascular: Denies chest pain. Respiratory: Denies shortness of breath. Gastrointestinal: No abdominal pain.  No nausea, no vomiting.  No diarrhea.  No constipation. Genitourinary: Negative for dysuria. Musculoskeletal: Negative for back pain. Skin: Large abscess on her upper inner thigh that drained earlier today Neurological: Negative for headaches, focal weakness or numbness.  10-point ROS otherwise negative.  ____________________________________________   PHYSICAL EXAM:  VITAL SIGNS: ED Triage Vitals  Enc Vitals Group     BP 06/01/16 2333 123/89 mmHg  Pulse Rate 06/01/16 2333 62     Resp 06/01/16 2333 18     Temp 06/01/16 2333 98.1 F (36.7 C)     Temp Source 06/01/16 2333 Oral     SpO2 06/01/16 2333 100 %     Weight 06/01/16 2333 199 lb (90.266 kg)     Height 06/01/16 2333  (1.676 m)     Head Cir --      Peak Flow --      Pain Score 06/01/16 2334 9     Pain Loc --      Pain Edu? --      Excl. in GC? --     Constitutional: Alert and oriented. Well appearing and in no acute distress. Eyes: Conjunctivae are normal. PERRL. EOMI. Mouth/Throat: Mucous membranes are moist.   Neck: No stridor.  No meningeal signs.   Cardiovascular: Normal rate, regular rhythm. Good peripheral circulation. Grossly  normal heart sounds.   Respiratory: Normal respiratory effort.  No retractions.  Musculoskeletal: No lower extremity tenderness nor edema. No gross deformities of extremities. Neurologic:  Normal speech and language. No gross focal neurologic deficits are appreciated.  Skin:  Large 4.5-cm in diameter abscess with no surrounding or spreading cellulitis.  No open wound that is actively draining at this time. Psychiatric: Mood and affect are normal. Speech and behavior are normal.  ____________________________________________   LABS (all labs ordered are listed, but only abnormal results are displayed)  Labs Reviewed  AEROBIC CULTURE (SUPERFICIAL SPECIMEN)  AEROBIC/ANAEROBIC CULTURE (SURGICAL/DEEP WOUND)   ____________________________________________  EKG  None ____________________________________________  RADIOLOGY   No results found.  ____________________________________________   PROCEDURES  Procedure(s) performed:   Marland KitchenMarland KitchenIncision and Drainage Date/Time: 06/02/2016 1:45 AM Performed by: Loleta Rose Authorized by: Loleta Rose Consent: Verbal consent obtained. Risks and benefits: risks, benefits and alternatives were discussed Consent given by: patient Patient understanding: patient states understanding of the procedure being performed Test results: test results available and properly labeled Imaging studies: imaging studies available Required items: required blood products, implants, devices, and special equipment available Patient identity confirmed: verbally with patient and arm band Time out: Immediately prior to procedure a "time out" was called to verify the correct patient, procedure, equipment, support staff and site/side marked as required. Type: abscess Body area: lower extremity (proximal right inner thigh) Anesthesia: local infiltration Local anesthetic: lidocaine 1% without epinephrine Anesthetic total: 10 ml Patient sedated: no Scalpel size:  11 Needle gauge: 22 Incision type: single straight Incision depth: subcutaneous Complexity: complex Drainage: purulent Drainage amount: copious Wound treatment: wound left open Packing material: 1/2 in iodoform gauze Patient tolerance: Patient tolerated the procedure well with no immediate complications Comments: approx 4.5 cm in diameter, at least 2 cm deep.  Culture swab sent.     ____________________________________________   INITIAL IMPRESSION / ASSESSMENT AND PLAN / ED COURSE  Pertinent labs & imaging results that were available during my care of the patient were reviewed by me and considered in my medical decision making (see chart for details).  Successful complex I&D of large thigh abscess.  Normally I do not pack wounds of this size but given its depth as well as its diameter of approximately 4 and half centimeters I felt like it would benefit from packing.  I encouraged her to follow up with either her primary care doctor in about 48 hours or to go to the urgent care for reassessment and to have the packing removed.  I gave my usual and customary return precautions.  No indication for antibiotics.    ____________________________________________  FINAL CLINICAL IMPRESSION(S) / ED DIAGNOSES  Final diagnoses:  Cutaneous abscess of right lower extremity     MEDICATIONS GIVEN DURING THIS VISIT:  Medications  oxyCODONE-acetaminophen (PERCOCET/ROXICET) 5-325 MG per tablet 2 tablet (2 tablets Oral Given 06/02/16 0045)  lidocaine (PF) (XYLOCAINE) 1 % injection 10 mL (10 mLs Other Given 06/02/16 0047)     NEW OUTPATIENT MEDICATIONS STARTED DURING THIS VISIT:  Discharge Medication List as of 06/02/2016  2:07 AM    START taking these medications   Details  docusate sodium (COLACE) 100 MG capsule Take 1 tablet once or twice daily as needed for constipation while taking narcotic pain medicine, Print    HYDROcodone-acetaminophen (NORCO/VICODIN) 5-325 MG tablet Take 1-2  tablets by mouth every 4 (four) hours as needed for moderate pain., Starting 06/02/2016, Until Discontinued, Print          Note:  This document was prepared using Dragon voice recognition software and may include unintentional dictation errors.   Loleta Roseory Liahna Brickner, MD 06/02/16 [phone]

## 2016-06-08 LAB — AEROBIC/ANAEROBIC CULTURE (SURGICAL/DEEP WOUND)

## 2016-06-08 LAB — AEROBIC/ANAEROBIC CULTURE W GRAM STAIN (SURGICAL/DEEP WOUND)

## 2016-06-09 NOTE — Progress Notes (Signed)
CONSULT NOTE - INITIAL  Pharmacy Consult for ED culture: Wound culture   Allergies  Allergen Reactions  . Flagyl [Metronidazole] Hives    Patient Measurements: Height: 5\' 6"  (167.6 cm) Weight: 199 lb (90.266 kg) IBW/kg (Calculated) : 59.3   Microbiology: Recent Results (from the past 720 hour(s))  Aerobic/Anaerobic Culture (surgical/deep wound)     Status: None   Collection Time: 06/02/16 12:43 AM  Result Value Ref Range Status   Specimen Description ABSCESS LEG RIGHT  Final   Special Requests NONE  Final   Gram Stain   Final    MODERATE WBC PRESENT,BOTH PMN AND MONONUCLEAR MODERATE GRAM POSITIVE COCCI IN CLUSTERS [facility]    Culture FEW PEPTOSTREPTOCOCCUS SPECIES  Final   Report Status 06/08/2016 FINAL  Final   Assessment: 30 yo female seen in ED for absecc on right proximal inner thigh. Pt had I& D with instruction to f/u with PCP. No antibiotics given.   Aerobic/anaerobic wound culture showing few peptostreptococcus species.   Plan:  [facility], patient has f/u appointment in a week. Faxed culture results to providers attention.   Cher NakaiSheema Shekela Goodridge, PharmD Clinical Pharmacist 06/09/2016 8:21 PM

## 2016-08-04 ENCOUNTER — Emergency Department
Admission: EM | Admit: 2016-08-04 | Discharge: 2016-08-04 | Disposition: A | Discharge status: Home / Self Care | Payer: Medicaid Other | Attending: Emergency Medicine | Admitting: Emergency Medicine

## 2016-08-04 ENCOUNTER — Encounter: Payer: Self-pay | Admitting: Emergency Medicine

## 2016-08-04 DIAGNOSIS — Z3201 Encounter for pregnancy test, result positive: Secondary | ICD-10-CM

## 2016-08-04 DIAGNOSIS — Z3A01 Less than 8 weeks gestation of pregnancy: Secondary | ICD-10-CM | POA: Insufficient documentation

## 2016-08-04 DIAGNOSIS — R05 Cough: Secondary | ICD-10-CM | POA: Diagnosis present

## 2016-08-04 DIAGNOSIS — O99331 Smoking (tobacco) complicating pregnancy, first trimester: Secondary | ICD-10-CM | POA: Insufficient documentation

## 2016-08-04 DIAGNOSIS — J00 Acute nasopharyngitis [common cold]: Secondary | ICD-10-CM | POA: Diagnosis not present

## 2016-08-04 DIAGNOSIS — O26891 Other specified pregnancy related conditions, first trimester: Secondary | ICD-10-CM | POA: Insufficient documentation

## 2016-08-04 DIAGNOSIS — F17200 Nicotine dependence, unspecified, uncomplicated: Secondary | ICD-10-CM | POA: Diagnosis not present

## 2016-08-04 LAB — POCT PREGNANCY, URINE: Preg Test, Ur: POSITIVE — AB

## 2016-08-04 MED ORDER — PSEUDOEPH-BROMPHEN-DM 30-2-10 MG/5ML PO SYRP: 10.0000 mL | ORAL_SOLUTION | Freq: Four times a day (QID) | ORAL | 0 refills | Status: DC | PRN

## 2016-08-04 NOTE — Discharge Instructions (Signed)
May take Tylenol as needed.   Saline nasal rinse.   May not take any further NyQuil.

## 2016-08-04 NOTE — ED Notes (Signed)
Patient states "I didn't go to work today because my throat was scratchy so I really just need a work note and I might as well be seen at the same time".   Informed me that the scratchy throat, cough, and congestion just started yesterday and she took some OTC Nyquil which gave her some relief.

## 2016-08-04 NOTE — ED Triage Notes (Signed)
Reports coughing, sneezing and bodyaches

## 2016-08-04 NOTE — ED Provider Notes (Signed)
[facility] Emergency Department Provider Note  ____________________________________________  Time seen: Approximately 12:23 PM  I have reviewed the triage vital signs and the nursing notes.   HISTORY  Chief Complaint Cough    HPI Tora KindredDeancia Brumbaugh is a 30 y.o. female, NAD, presents to the emergency department with one-day history of cough, sore throat and runny nose. Smptoms started yesterday while patient was working. Describes throat as feeling scratchy. Denies any fever, chills, sweats, chest pain, chest congestion, shortness of breath, wheezing. Has not had any ear pain, nasal drainage, ear drainage, sinus pressure. Patient has taken 3 doses of NyQuil with some relief. Patient's daughter has had diarrhea, but no similar symptoms to what patient has been experiencing. No other sick contacts.   Patient also requests a urine pregnancy test that she has not had a menstrual cycle approximately 2 months. Denies any abdominal pain, nausea, vomiting, vaginal discharge/bleeding, pelvic pain.   Past Medical History:  Diagnosis Date  . Anxiety   . Bipolar 1 disorder (HCC)   . Depression     Patient Active Problem List   Diagnosis Date Noted  . Substance induced mood disorder (HCC) 09/11/2015  . Alcohol abuse 09/11/2015  . Bipolar disorder, current episode hypomanic (HCC) 09/04/2015  . Cannabis abuse 09/04/2015  . Homelessness 09/04/2015    History reviewed. No pertinent surgical history.  Prior to Admission medications   Medication Sig Start Date End Date Taking? Authorizing Provider  docusate sodium (COLACE) 100 MG capsule Take 1 tablet once or twice daily as needed for constipation while taking narcotic pain medicine 06/02/16   Loleta Roseory Forbach, MD  HYDROcodone-acetaminophen (NORCO/VICODIN) 5-325 MG tablet Take 1-2 tablets by mouth every 4 (four) hours as needed for moderate pain. 06/02/16   Loleta Roseory Forbach, MD  lithium 300 MG tablet Take 1 tablet (300 mg total) by  mouth 3 (three) times daily. 09/16/15   Audery AmelJohn T Clapacs, MD  naproxen (NAPROSYN) 500 MG tablet Take 1 tablet (500 mg total) by mouth 2 (two) times daily with a meal. 09/22/15   Sharman CheekPhillip Stafford, MD  ranitidine (ZANTAC) 150 MG capsule Take 1 capsule (150 mg total) by mouth 2 (two) times daily. 09/22/15   Sharman CheekPhillip Stafford, MD  sucralfate (CARAFATE) 1 G tablet Take 1 tablet (1 g total) by mouth 4 (four) times daily. 09/22/15   Sharman CheekPhillip Stafford, MD    Allergies Flagyl [metronidazole]  No family history on file.  Social History Social History  Substance Use Topics  . Smoking status: Current Some Day Smoker  . Smokeless tobacco: Not on file  . Alcohol use Yes     Review of Systems  Constitutional: No fever/chills Eyes: No visual changes. No discharge, Redness, pain ENT: Positive nasal drainage and congestion with scratchy throat. No ear pain, ear drainage, sinus pressure, sneezing. Cardiovascular: No chest pain. Respiratory: Positive nonproductive cough. No shortness of breath. No wheezing.  Gastrointestinal: No abdominal pain.  No nausea, vomiting.   Genitourinary: Negative for dysuria, Hematuria, pelvic pain, vaginal discharge/bleeding. Musculoskeletal: Negative for general myalgias or joint swelling.  Skin: Negative for rash. Neurological: Negative for headaches, focal weakness or numbness. No tingling. 10-point ROS otherwise negative.  ____________________________________________   PHYSICAL EXAM:  VITAL SIGNS: ED Triage Vitals  Enc Vitals Group     BP 08/04/16 1119 134/69     Pulse Rate 08/04/16 1119 100     Resp 08/04/16 1119 16     Temp 08/04/16 1119 97.7 F (36.5 C)     Temp Source  08/04/16 1119 Oral     SpO2 08/04/16 1119 97 %     Weight 08/04/16 1120 199 lb (90.3 kg)     Height 08/04/16 1120 5\' 6"  (1.676 m)     Head Circumference --      Peak Flow --      Pain Score 08/04/16 1121 5     Pain Loc --      Pain Edu? --      Excl. in GC? --      Constitutional:  Alert and oriented. Well appearing and in no acute distress. Reclining on stretcher and watching television.  Eyes: Conjunctivae are normal without icterus or injection. Head: Atraumatic. ENT:      Ears: TMs visualized bilaterally without erythema, effusion, bulging, perforation. Bilateral canals without erythema, swelling, discharge.       Nose: Trace congestion with injection and trace clear rhinorrhea.       Mouth/Throat: Mucous membranes are moist. Pharynx without erythema, swelling, exudate. Uvula is midline. Airways pain. Clear postnasal drip noted.  Neck: No stridor.  Supple with full range of motion. Hematological/Lymphatic/Immunilogical: No cervical lymphadenopathy. Cardiovascular: Normal rate, regular rhythm. Normal S1 and S2.  Good peripheral circulation. Respiratory: Normal respiratory effort without tachypnea or retractions. Lungs CTAB with breath sounds noted in all lung fields. No wheeze, rhonchi, rales. Gastrointestinal: Soft and nontender without distention or guarding in all quadrants. Neurologic:  Normal speech and language. No gross focal neurologic deficits are appreciated.  Skin:  Skin is warm, dry and intact. No rash noted. Psychiatric: Mood and affect are normal. Speech and behavior are normal. Patient exhibits appropriate insight and judgement.   ____________________________________________   LABS (all labs ordered are listed, but only abnormal results are displayed)  Labs Reviewed  POCT PREGNANCY, URINE - Abnormal; Notable for the following:       Result Value   Preg Test, Ur POSITIVE (*)    All other components within normal limits  POC URINE PREG, ED   ____________________________________________  EKG  None ____________________________________________  RADIOLOGY  None ____________________________________________    PROCEDURES  Procedure(s) performed: None   Procedures   Medications - No data to  display   ____________________________________________   INITIAL IMPRESSION / ASSESSMENT AND PLAN / ED COURSE  Pertinent labs & imaging results that were available during my care of the patient were reviewed by me and considered in my medical decision making (see chart for details).  Clinical Course    Patient's diagnosis is consistent with Acute nasopharyngitis and positive pregnancy test. Throughout the patient's ED course as well as at the time of discharge, she had no abdominal pain, nausea, vomiting, pelvic pain, vaginal discharge or bleeding. Patient will be discharged home with instructions that she can take Tylenol as needed for pain. May use nasal saline flushes and Sudafed as needed for cold symptoms. Patient is to follow up with her primary care provider at [facility] if symptoms persist past this treatment course. Patient is advised to establish care with OB/GYN for prenatal care as soon as possible. Patient is given ED precautions to return to the ED for any worsening or new symptoms.    ____________________________________________  FINAL CLINICAL IMPRESSION(S) / ED DIAGNOSES  Final diagnoses:  Acute nasopharyngitis (common cold)  Positive pregnancy test      NEW MEDICATIONS STARTED DURING THIS VISIT:  Discharge Medication List as of 08/04/2016 12:56 PM          Jami L Hagler, PA-C 08/04/16 1406  Governor Rooks, MD 08/04/16 1537

## 2016-08-25 LAB — OB RESULTS CONSOLE TSH: TSH: 0.493

## 2016-08-25 LAB — OB RESULTS CONSOLE RUBELLA ANTIBODY, IGM: RUBELLA: IMMUNE

## 2016-08-25 LAB — OB RESULTS CONSOLE HEPATITIS B SURFACE ANTIGEN: HEP B S AG: NEGATIVE

## 2016-08-25 LAB — OB RESULTS CONSOLE RPR: RPR: NONREACTIVE

## 2016-08-25 LAB — OB RESULTS CONSOLE ANTIBODY SCREEN: Antibody Screen: NEGATIVE

## 2016-08-25 LAB — OB RESULTS CONSOLE GC/CHLAMYDIA
Chlamydia: NEGATIVE
Gonorrhea: NEGATIVE

## 2016-08-25 LAB — OB RESULTS CONSOLE HIV ANTIBODY (ROUTINE TESTING): HIV: NONREACTIVE

## 2016-08-25 LAB — OB RESULTS CONSOLE ABO/RH: RH TYPE: POSITIVE

## 2016-08-25 LAB — OB RESULTS CONSOLE VARICELLA ZOSTER ANTIBODY, IGG: Varicella: IMMUNE

## 2016-11-28 HISTORY — DX: Maternal care for unspecified type scar from previous cesarean delivery: O34.219

## 2016-12-08 IMAGING — CT CT HEAD W/O CM
1 of 2 series · 13 of 30 positions shown, 17 images · non-contrast
Comparison: CT of the head performed 06/20/2014

CLINICAL DATA: Acute onset of syncope. Generalized malaise, cough
and congestion. Headache. Initial encounter.

EXAM:
CT HEAD WITHOUT CONTRAST
TECHNIQUE: Contiguous axial images were obtained from the base of the skull
through the vertex without intravenous contrast.

[Series 2: head wo · axial · 0.39mm/px · z∈[+0,+125]mm · 13 of 31 slices shown, 17 images]
[im 3/31  brain]
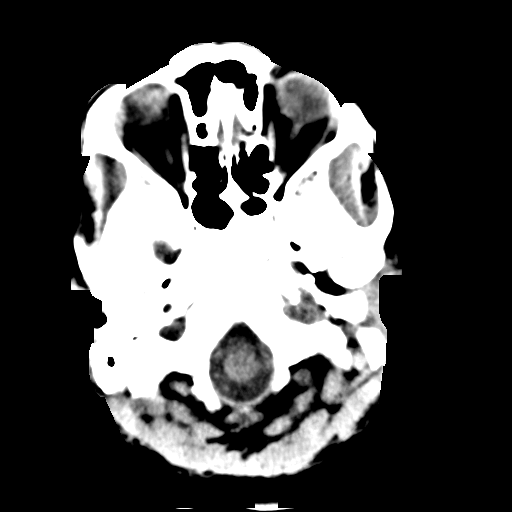
[im 3/31  bone]
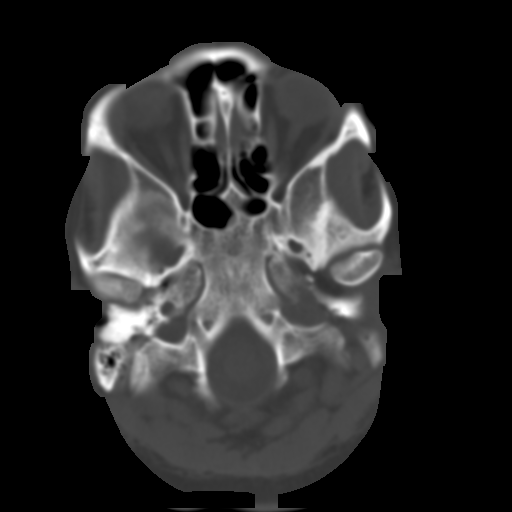
[im 5/31  brain]
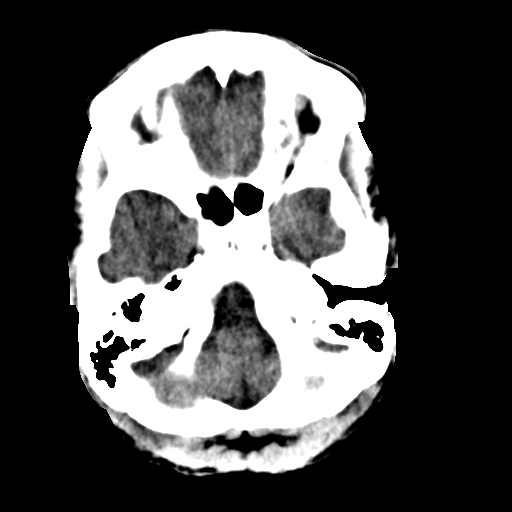
[im 7/31  brain]
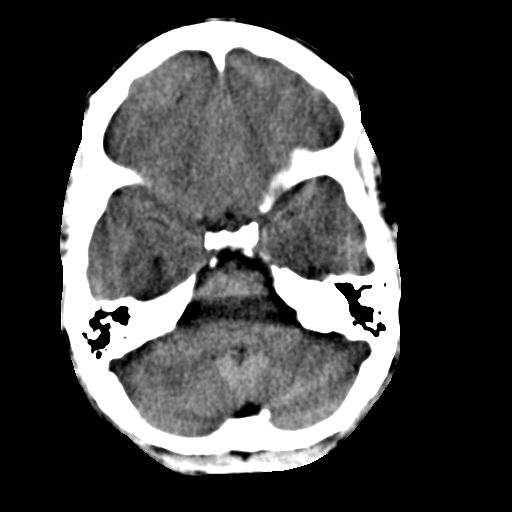
[im 9/31  brain]
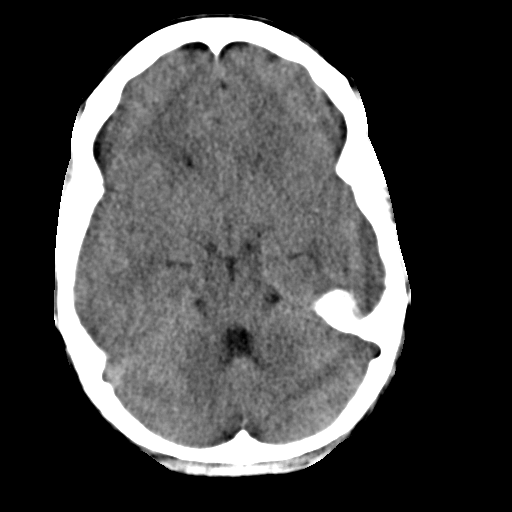
[im 11/31  brain]
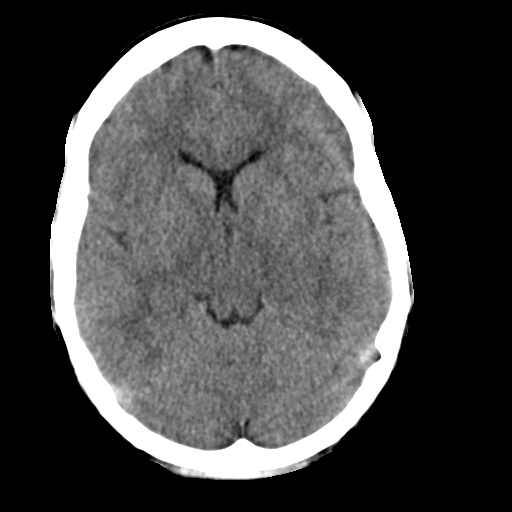
[im 11/31  bone]
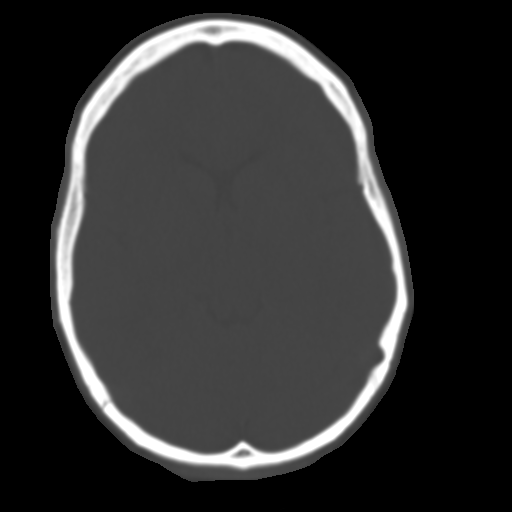
[im 13/31  brain]
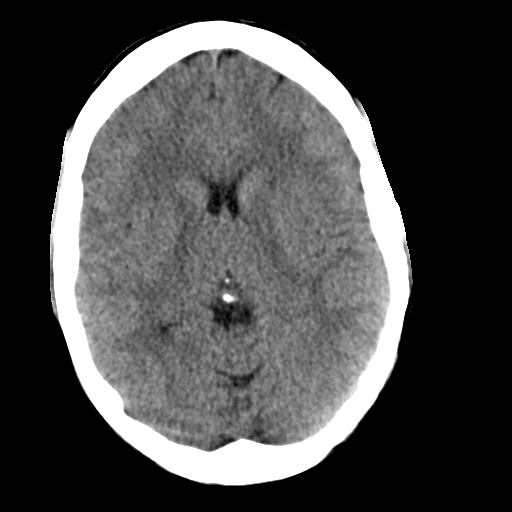
[im 16/31  brain]
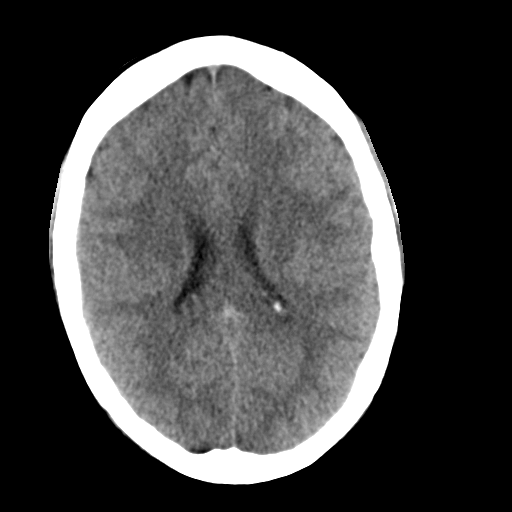
[im 18/31  brain]
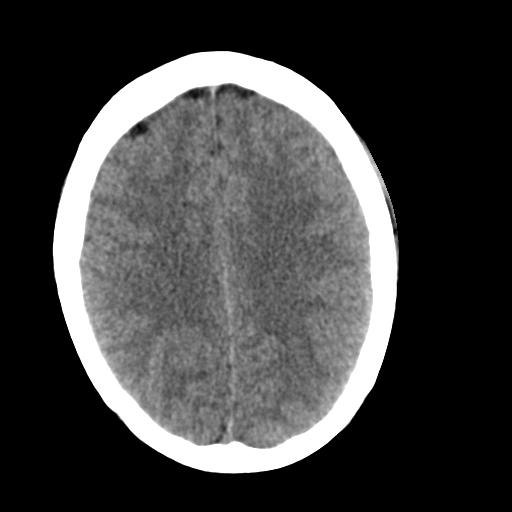
[im 20/31  brain]
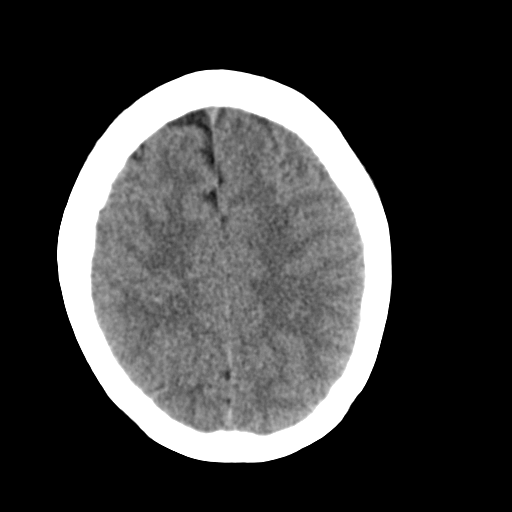
[im 20/31  bone]
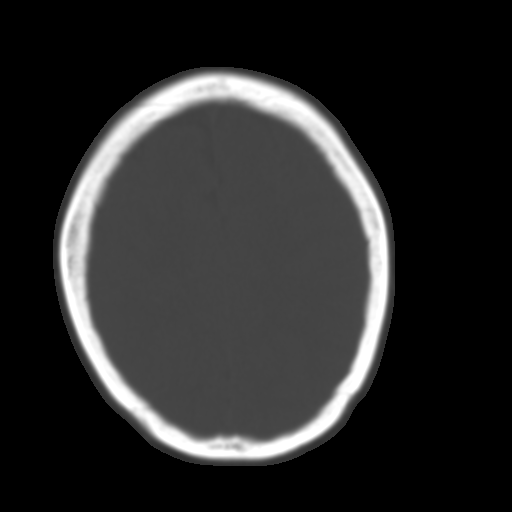
[im 22/31  brain]
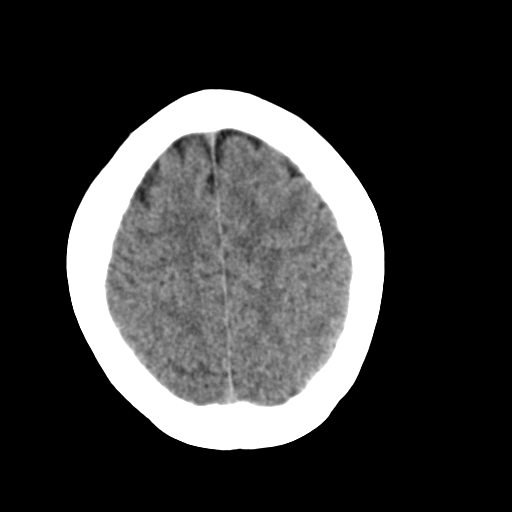
[im 24/31  brain]
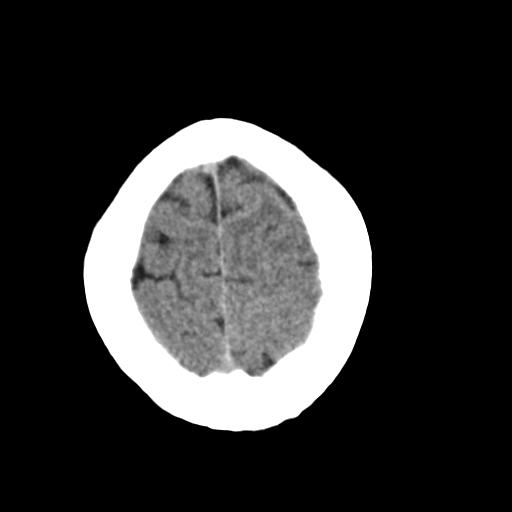
[im 26/31  brain]
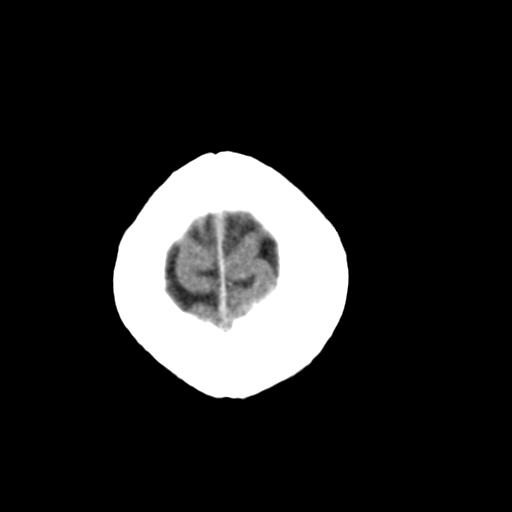
[im 28/31  brain]
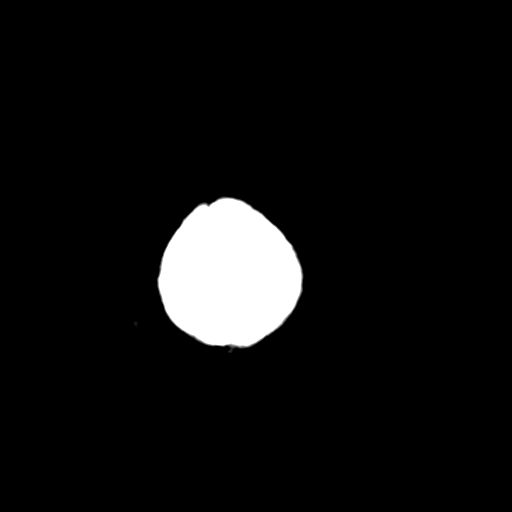
[im 28/31  bone]
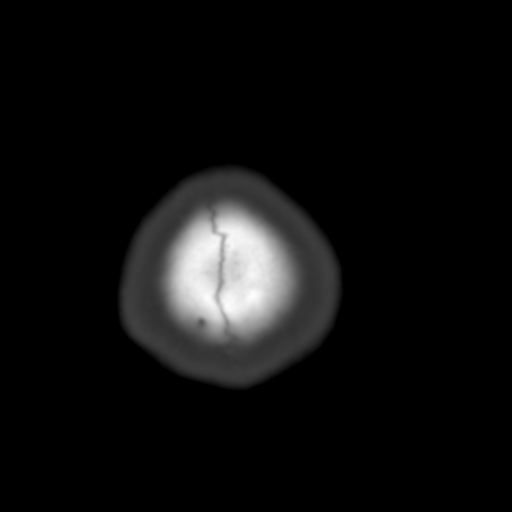

[13 of 30 positions shown; findings below may reference images not displayed]

FINDINGS: There is no evidence of acute infarction, mass lesion, or intra- or
extra-axial hemorrhage on CT.

The posterior fossa, including the cerebellum, brainstem and fourth
ventricle, is within normal limits. The third and lateral
ventricles, and basal ganglia are unremarkable in appearance. The
cerebral hemispheres are symmetric in appearance, with normal
gray-white differentiation. No mass effect or midline shift is seen.

There is no evidence of fracture; visualized osseous structures are
unremarkable in appearance. The visualized portions of the orbits
are within normal limits. The paranasal sinuses and mastoid air
cells are well-aerated. No significant soft tissue abnormalities are
seen.
IMPRESSION: Unremarkable noncontrast CT of the head.

## 2017-01-11 ENCOUNTER — Encounter: Payer: Self-pay | Admitting: *Deleted

## 2017-01-11 ENCOUNTER — Encounter
Admission: RE | Admit: 2017-01-11 | Discharge: 2017-01-11 | Disposition: A | Discharge status: Home / Self Care | Payer: Medicaid Other | Source: Ambulatory Visit | Attending: Obstetrics and Gynecology | Admitting: Obstetrics and Gynecology

## 2017-01-11 DIAGNOSIS — Z01812 Encounter for preprocedural laboratory examination: Secondary | ICD-10-CM

## 2017-01-11 HISTORY — DX: Nontoxic goiter, unspecified: E04.9

## 2017-01-11 HISTORY — DX: Dyspnea, unspecified: R06.00

## 2017-01-11 LAB — CBC
HCT: 35.6 % (ref 35.0–47.0)
Hemoglobin: 11.8 g/dL — ABNORMAL LOW (ref 12.0–16.0)
MCH: 30.8 pg (ref 26.0–34.0)
MCHC: 33.3 g/dL (ref 32.0–36.0)
MCV: 92.6 fL (ref 80.0–100.0)
PLATELETS: 243 10*3/uL (ref 150–440)
RBC: 3.84 MIL/uL (ref 3.80–5.20)
RDW: 13.3 % (ref 11.5–14.5)
WBC: 11.6 10*3/uL — ABNORMAL HIGH (ref 3.6–11.0)

## 2017-01-11 LAB — TYPE AND SCREEN
ABO/RH(D): B POS
ANTIBODY SCREEN: NEGATIVE
Extend sample reason: UNDETERMINED

## 2017-01-11 NOTE — Patient Instructions (Signed)
  Your procedure is scheduled on: 01/12/17 Thurs arrive at 7:25am Report to Labor and delivery 3rd floor medical mall [facility](Medical Mall Entrance-registration Remember: Instructions that are not followed completely may result in serious medical risk, up to and including death, or upon the discretion of your surgeon and anesthesiologist your surgery may need to be rescheduled.    _x___ 1. Do not eat food or drink liquids after midnight. No gum chewing or hard candies.     __x__ 2. No Alcohol for 24 hours before or after surgery.   __x__3. No Smoking for 24 prior to surgery.   ____  4. Bring all medications with you on the day of surgery if instructed.    __x__ 5. Notify your doctor if there is any change in your medical condition     (cold, fever, infections).     Do not wear jewelry, make-up, hairpins, clips or nail polish.  Do not wear lotions, powders, or perfumes. You may wear deodorant.  Do not shave 48 hours prior to surgery. Men may shave face and neck.  Do not bring valuables to the hospital.    [facility] is not responsible for any belongings or valuables.               Contacts, dentures or bridgework may not be worn into surgery.  Leave your suitcase in the car. After surgery it may be brought to your room.  For patients admitted to the hospital, discharge time is determined by your treatment team.   Patients discharged the day of surgery will not be allowed to drive home.  You will need someone to drive you home and stay with you the night of your procedure.    Please read over the following fact sheets that you were given:   [facility] Preparing for Surgery and or MRSA Information   _x___ Take these medicines the morning of surgery with A SIP OF WATER:    1. None  2.  3.  4.  5.  6.  ____Fleets enema or Magnesium Citrate as directed.   _x___ Use CHG Soap or sage wipes as directed on instruction sheet   ____ Use inhalers on the day of surgery and bring to hospital  day of surgery  ____ Stop metformin 2 days prior to surgery    ____ Take 1/2 of usual insulin dose the night before surgery and none on the morning of           surgery.   ____ Stop Aspirin, Coumadin, Pllavix ,Eliquis, Effient, or Pradaxa  x__ Stop Anti-inflammatories such as Advil, Aleve, Ibuprofen, Motrin, Naproxen,          Naprosyn, Goodies powders or aspirin products. Ok to take Tylenol.   ____ Stop supplements until after surgery.    ____ Bring C-Pap to the hospital.

## 2017-01-12 ENCOUNTER — Encounter
Admission: RE | Disposition: A | Discharge status: Home / Self Care | Payer: Self-pay | Source: Ambulatory Visit | Attending: Obstetrics and Gynecology

## 2017-01-12 ENCOUNTER — Inpatient Hospital Stay
Admission: RE | Admit: 2017-01-12 | Discharge: 2017-01-14 | DRG: 766 | Disposition: A | Discharge status: Home / Self Care | Payer: Medicaid Other | Source: Ambulatory Visit | Attending: Obstetrics and Gynecology | Admitting: Obstetrics and Gynecology

## 2017-01-12 ENCOUNTER — Inpatient Hospital Stay: Payer: Medicaid Other | Admitting: Anesthesiology

## 2017-01-12 DIAGNOSIS — E669 Obesity, unspecified: Secondary | ICD-10-CM | POA: Diagnosis present

## 2017-01-12 DIAGNOSIS — O99214 Obesity complicating childbirth: Secondary | ICD-10-CM | POA: Diagnosis present

## 2017-01-12 DIAGNOSIS — O9081 Anemia of the puerperium: Secondary | ICD-10-CM | POA: Diagnosis present

## 2017-01-12 DIAGNOSIS — Z98891 History of uterine scar from previous surgery: Secondary | ICD-10-CM

## 2017-01-12 DIAGNOSIS — O34219 Maternal care for unspecified type scar from previous cesarean delivery: Secondary | ICD-10-CM | POA: Diagnosis present

## 2017-01-12 DIAGNOSIS — Z3A39 39 weeks gestation of pregnancy: Secondary | ICD-10-CM | POA: Diagnosis not present

## 2017-01-12 DIAGNOSIS — O34211 Maternal care for low transverse scar from previous cesarean delivery: Principal | ICD-10-CM | POA: Diagnosis present

## 2017-01-12 DIAGNOSIS — Z6837 Body mass index (BMI) 37.0-37.9, adult: Secondary | ICD-10-CM

## 2017-01-12 LAB — URINE DRUG SCREEN, QUALITATIVE (ARMC ONLY)
AMPHETAMINES, UR SCREEN: NOT DETECTED
BENZODIAZEPINE, UR SCRN: NOT DETECTED
Barbiturates, Ur Screen: NOT DETECTED
Cannabinoid 50 Ng, Ur ~~LOC~~: POSITIVE — AB
Cocaine Metabolite,Ur ~~LOC~~: NOT DETECTED
MDMA (ECSTASY) UR SCREEN: NOT DETECTED
Methadone Scn, Ur: NOT DETECTED
Opiate, Ur Screen: NOT DETECTED
Phencyclidine (PCP) Ur S: NOT DETECTED
Tricyclic, Ur Screen: NOT DETECTED

## 2017-01-12 LAB — RPR: RPR Ser Ql: NONREACTIVE

## 2017-01-12 SURGERY — Surgical Case
Anesthesia: Spinal | Site: Abdomen | Wound class: Clean Contaminated

## 2017-01-12 MED ORDER — MORPHINE SULFATE (PF) 0.5 MG/ML IJ SOLN
INTRAMUSCULAR | Status: DC | PRN
  Administered 2017-01-12: .1 mg via EPIDURAL

## 2017-01-12 MED ORDER — COCONUT OIL OIL: 1.0000 "application " | TOPICAL_OIL | Status: DC | PRN

## 2017-01-12 MED ORDER — LACTATED RINGERS IV SOLN
INTRAVENOUS | Status: DC
  Administered 2017-01-12 – 2017-01-13 (×2): via INTRAVENOUS

## 2017-01-12 MED ORDER — DIPHENHYDRAMINE HCL 25 MG PO CAPS: 25.0000 mg | ORAL_CAPSULE | Freq: Four times a day (QID) | ORAL | Status: DC | PRN

## 2017-01-12 MED ORDER — NALBUPHINE HCL 10 MG/ML IJ SOLN: 5.0000 mg | Freq: Once | INTRAMUSCULAR | Status: DC | PRN

## 2017-01-12 MED ORDER — ONDANSETRON HCL 4 MG/2ML IJ SOLN: 4.0000 mg | Freq: Three times a day (TID) | INTRAMUSCULAR | Status: DC | PRN

## 2017-01-12 MED ORDER — BUPIVACAINE IN DEXTROSE 0.75-8.25 % IT SOLN
INTRATHECAL | Status: DC | PRN
  Administered 2017-01-12: 1.6 mL via INTRATHECAL

## 2017-01-12 MED ORDER — IBUPROFEN 600 MG PO TABS
600.0000 mg | ORAL_TABLET | Freq: Four times a day (QID) | ORAL | Status: DC
  Administered 2017-01-13 (×2): 600 mg via ORAL
  Filled 2017-01-12 (×2): qty 1

## 2017-01-12 MED ORDER — SOD CITRATE-CITRIC ACID 500-334 MG/5ML PO SOLN
30.0000 mL | ORAL | Status: AC
  Administered 2017-01-12: 30 mL via ORAL
  Filled 2017-01-12: qty 15

## 2017-01-12 MED ORDER — NALOXONE HCL 0.4 MG/ML IJ SOLN: 0.4000 mg | INTRAMUSCULAR | Status: DC | PRN

## 2017-01-12 MED ORDER — IBUPROFEN 600 MG PO TABS
600.0000 mg | ORAL_TABLET | Freq: Four times a day (QID) | ORAL | Status: DC | PRN
  Administered 2017-01-12 (×2): 600 mg via ORAL
  Filled 2017-01-12 (×2): qty 1

## 2017-01-12 MED ORDER — EPHEDRINE 5 MG/ML INJ
INTRAVENOUS | Status: AC
  Filled 2017-01-12: qty 10

## 2017-01-12 MED ORDER — OXYTOCIN 40 UNITS IN LACTATED RINGERS INFUSION - SIMPLE MED
INTRAVENOUS | Status: AC
  Filled 2017-01-12: qty 1000

## 2017-01-12 MED ORDER — WITCH HAZEL-GLYCERIN EX PADS: 1.0000 "application " | MEDICATED_PAD | CUTANEOUS | Status: DC | PRN

## 2017-01-12 MED ORDER — DIPHENHYDRAMINE HCL 25 MG PO CAPS: 25.0000 mg | ORAL_CAPSULE | ORAL | Status: DC | PRN

## 2017-01-12 MED ORDER — OXYTOCIN 40 UNITS IN LACTATED RINGERS INFUSION - SIMPLE MED
INTRAVENOUS | Status: DC | PRN
Start: 2017-01-12 — End: 2017-01-12
  Administered 2017-01-12: 1000 mL via INTRAVENOUS

## 2017-01-12 MED ORDER — ACETAMINOPHEN 325 MG PO TABS: 650.0000 mg | ORAL_TABLET | ORAL | Status: DC | PRN

## 2017-01-12 MED ORDER — OXYTOCIN 40 UNITS IN LACTATED RINGERS INFUSION - SIMPLE MED: 2.5000 [IU]/h | INTRAVENOUS | Status: AC

## 2017-01-12 MED ORDER — EPHEDRINE SULFATE 50 MG/ML IJ SOLN
INTRAMUSCULAR | Status: DC | PRN
  Administered 2017-01-12 (×2): 5 mg via INTRAVENOUS

## 2017-01-12 MED ORDER — DIBUCAINE 1 % RE OINT: 1.0000 "application " | TOPICAL_OINTMENT | RECTAL | Status: DC | PRN

## 2017-01-12 MED ORDER — LIDOCAINE HCL (PF) 1 % IJ SOLN
INTRAMUSCULAR | Status: DC | PRN
  Administered 2017-01-12: 3 mL via SUBCUTANEOUS

## 2017-01-12 MED ORDER — SIMETHICONE 80 MG PO CHEW
80.0000 mg | CHEWABLE_TABLET | Freq: Three times a day (TID) | ORAL | Status: DC
  Administered 2017-01-12 – 2017-01-14 (×4): 80 mg via ORAL
  Filled 2017-01-12 (×4): qty 1

## 2017-01-12 MED ORDER — NALBUPHINE HCL 10 MG/ML IJ SOLN: 5.0000 mg | INTRAMUSCULAR | Status: DC | PRN

## 2017-01-12 MED ORDER — PRENATAL MULTIVITAMIN CH
1.0000 | ORAL_TABLET | Freq: Every day | ORAL | Status: DC
  Administered 2017-01-12 – 2017-01-14 (×3): 1 via ORAL
  Filled 2017-01-12 (×3): qty 1

## 2017-01-12 MED ORDER — DIPHENHYDRAMINE HCL 50 MG/ML IJ SOLN: 12.5000 mg | INTRAMUSCULAR | Status: DC | PRN

## 2017-01-12 MED ORDER — MEPERIDINE HCL 25 MG/ML IJ SOLN: 6.2500 mg | INTRAMUSCULAR | Status: DC | PRN

## 2017-01-12 MED ORDER — BUPIVACAINE HCL (PF) 0.5 % IJ SOLN
INTRAMUSCULAR | Status: DC | PRN
  Administered 2017-01-12: 10 mL

## 2017-01-12 MED ORDER — ONDANSETRON HCL 4 MG/2ML IJ SOLN: 4.0000 mg | Freq: Once | INTRAMUSCULAR | Status: DC | PRN

## 2017-01-12 MED ORDER — SCOPOLAMINE 1 MG/3DAYS TD PT72
1.0000 | MEDICATED_PATCH | Freq: Once | TRANSDERMAL | Status: DC
  Administered 2017-01-12: 1.5 mg via TRANSDERMAL
  Filled 2017-01-12: qty 1

## 2017-01-12 MED ORDER — LACTATED RINGERS IV SOLN
INTRAVENOUS | Status: DC
  Administered 2017-01-12: 09:00:00 via INTRAVENOUS

## 2017-01-12 MED ORDER — SIMETHICONE 80 MG PO CHEW: 80.0000 mg | CHEWABLE_TABLET | ORAL | Status: DC

## 2017-01-12 MED ORDER — BUPIVACAINE 0.25 % ON-Q PUMP DUAL CATH 400 ML
400.0000 mL | INJECTION | Status: DC
  Filled 2017-01-12 (×4): qty 400

## 2017-01-12 MED ORDER — SENNOSIDES-DOCUSATE SODIUM 8.6-50 MG PO TABS
2.0000 | ORAL_TABLET | ORAL | Status: DC
  Administered 2017-01-13: 2 via ORAL
  Filled 2017-01-12: qty 2

## 2017-01-12 MED ORDER — PHENYLEPHRINE 40 MCG/ML (10ML) SYRINGE FOR IV PUSH (FOR BLOOD PRESSURE SUPPORT)
PREFILLED_SYRINGE | INTRAVENOUS | Status: DC | PRN
  Administered 2017-01-12: 100 ug via INTRAVENOUS

## 2017-01-12 MED ORDER — OXYCODONE-ACETAMINOPHEN 5-325 MG PO TABS
2.0000 | ORAL_TABLET | ORAL | Status: DC | PRN
  Administered 2017-01-13 – 2017-01-14 (×5): 2 via ORAL
  Filled 2017-01-12 (×6): qty 2

## 2017-01-12 MED ORDER — FENTANYL CITRATE (PF) 100 MCG/2ML IJ SOLN: 25.0000 ug | INTRAMUSCULAR | Status: DC | PRN

## 2017-01-12 MED ORDER — SODIUM CHLORIDE 0.9% FLUSH: 3.0000 mL | INTRAVENOUS | Status: DC | PRN

## 2017-01-12 MED ORDER — CEFAZOLIN SODIUM-DEXTROSE 2-4 GM/100ML-% IV SOLN
2.0000 g | INTRAVENOUS | Status: AC
  Administered 2017-01-12: 2 g via INTRAVENOUS
  Filled 2017-01-12 (×2): qty 100

## 2017-01-12 MED ORDER — TETANUS-DIPHTH-ACELL PERTUSSIS 5-2.5-18.5 LF-MCG/0.5 IM SUSP: 0.5000 mL | Freq: Once | INTRAMUSCULAR | Status: DC

## 2017-01-12 MED ORDER — SIMETHICONE 80 MG PO CHEW
80.0000 mg | CHEWABLE_TABLET | ORAL | Status: DC | PRN
Start: 2017-01-12 — End: 2017-01-14

## 2017-01-12 MED ORDER — MENTHOL 3 MG MT LOZG
1.0000 | LOZENGE | OROMUCOSAL | Status: DC | PRN
  Filled 2017-01-12: qty 9

## 2017-01-12 MED ORDER — MORPHINE SULFATE (PF) 0.5 MG/ML IJ SOLN
INTRAMUSCULAR | Status: AC
  Filled 2017-01-12: qty 10

## 2017-01-12 MED ORDER — NALOXONE HCL 2 MG/2ML IJ SOSY
1.0000 ug/kg/h | PREFILLED_SYRINGE | INTRAVENOUS | Status: DC | PRN
  Filled 2017-01-12: qty 2

## 2017-01-12 MED ORDER — BUPIVACAINE HCL (PF) 0.5 % IJ SOLN
10.0000 mL | Freq: Once | INTRAMUSCULAR | Status: DC
  Filled 2017-01-12: qty 30

## 2017-01-12 MED ORDER — ONDANSETRON HCL 4 MG/2ML IJ SOLN
INTRAMUSCULAR | Status: AC
  Filled 2017-01-12: qty 2

## 2017-01-12 MED ORDER — LACTATED RINGERS IV SOLN
INTRAVENOUS | Status: DC
  Administered 2017-01-12: 1000 mL via INTRAVENOUS

## 2017-01-12 MED ORDER — OXYCODONE-ACETAMINOPHEN 5-325 MG PO TABS
1.0000 | ORAL_TABLET | ORAL | Status: DC | PRN
  Administered 2017-01-13: 1 via ORAL

## 2017-01-12 SURGICAL SUPPLY — 27 items
BAG COUNTER SPONGE EZ (MISCELLANEOUS) ×2 IMPLANT
CANISTER SUCT 3000ML (MISCELLANEOUS) ×3 IMPLANT
CATH KIT ON-Q SILVERSOAK 5IN (CATHETERS) ×6 IMPLANT
CHLORAPREP W/TINT 26ML (MISCELLANEOUS) ×6 IMPLANT
CLOSURE WOUND 1/2 X4 (GAUZE/BANDAGES/DRESSINGS) ×1
COUNTER SPONGE BAG EZ (MISCELLANEOUS) ×1
DRSG OPSITE POSTOP 4X10 (GAUZE/BANDAGES/DRESSINGS) ×3 IMPLANT
DRSG TELFA 3X8 NADH (GAUZE/BANDAGES/DRESSINGS) ×3 IMPLANT
ELECT CAUTERY BLADE 6.4 (BLADE) ×3 IMPLANT
ELECT REM PT RETURN 9FT ADLT (ELECTROSURGICAL) ×3
ELECTRODE REM PT RTRN 9FT ADLT (ELECTROSURGICAL) ×1 IMPLANT
GAUZE SPONGE 4X4 12PLY STRL (GAUZE/BANDAGES/DRESSINGS) ×3 IMPLANT
GLOVE BIO SURGEON STRL SZ7 (GLOVE) ×12 IMPLANT
GLOVE INDICATOR 7.5 STRL GRN (GLOVE) ×12 IMPLANT
GOWN STRL REUS W/ TWL LRG LVL3 (GOWN DISPOSABLE) ×3 IMPLANT
GOWN STRL REUS W/TWL LRG LVL3 (GOWN DISPOSABLE) ×6
LIQUID BAND (GAUZE/BANDAGES/DRESSINGS) ×3 IMPLANT
NS IRRIG 1000ML POUR BTL (IV SOLUTION) ×3 IMPLANT
PACK C SECTION AR (MISCELLANEOUS) ×3 IMPLANT
PAD OB MATERNITY 4.3X12.25 (PERSONAL CARE ITEMS) ×3 IMPLANT
PAD PREP 24X41 OB/GYN DISP (PERSONAL CARE ITEMS) ×3 IMPLANT
STRIP CLOSURE SKIN 1/2X4 (GAUZE/BANDAGES/DRESSINGS) ×2 IMPLANT
SUT MNCRL AB 4-0 PS2 18 (SUTURE) ×3 IMPLANT
SUT PDS AB 1 TP1 96 (SUTURE) ×6 IMPLANT
SUT VIC AB 0 CTX 36 (SUTURE)
SUT VIC AB 0 CTX36XBRD ANBCTRL (SUTURE) IMPLANT
SUT VIC AB 2-0 CT1 36 (SUTURE) ×3 IMPLANT

## 2017-01-12 NOTE — Anesthesia Procedure Notes (Addendum)
Spinal  Patient location during procedure: OR Start time: 01/12/2017 10:11 AM End time: 01/12/2017 10:13 AM Staffing Anesthesiologist: Naomie DeanKEPHART, WILLIAM K Resident/CRNA: Karoline CaldwellSTARR, Alonie Gazzola Performed: resident/CRNA  Preanesthetic Checklist Completed: patient identified, site marked, surgical consent, pre-op evaluation, timeout performed, IV checked, risks and benefits discussed and monitors and equipment checked Spinal Block Patient position: sitting Prep: ChloraPrep Patient monitoring: heart rate, continuous pulse ox and blood pressure Approach: midline Location: L4-5 Injection technique: single-shot Needle Needle type: Pencan  Needle gauge: 24 G Needle length: 10 cm Assessment Sensory level: T4 Additional Notes Patient tolerated procedure well; CSF return on aspiration before and after local administration

## 2017-01-12 NOTE — H&P (Signed)
Date of Initial paper H&P: 01/11/17  History reviewed, patient examined, no change in status, stable for surgery.

## 2017-01-12 NOTE — Anesthesia Preprocedure Evaluation (Signed)
Anesthesia Evaluation  Patient identified by MRN, date of birth, ID band Patient awake    Reviewed: Allergy & Precautions, NPO status , Patient's Chart, lab work & pertinent test results  History of Anesthesia Complications Negative for: history of anesthetic complications  Airway Mallampati: III       Dental   Pulmonary Current Smoker,           Cardiovascular negative cardio ROS       Neuro/Psych Anxiety Depression Bipolar Disorder    GI/Hepatic negative GI ROS, Neg liver ROS,   Endo/Other  negative endocrine ROS  Renal/GU negative Renal ROS     Musculoskeletal   Abdominal   Peds  Hematology   Anesthesia Other Findings   Reproductive/Obstetrics                             Anesthesia Physical Anesthesia Plan  ASA: II  Anesthesia Plan: Spinal   Post-op Pain Management:    Induction:   Airway Management Planned:   Additional Equipment:   Intra-op Plan:   Post-operative Plan:   Informed Consent: I have reviewed the patients History and Physical, chart, labs and discussed the procedure including the risks, benefits and alternatives for the proposed anesthesia with the patient or authorized representative who has indicated his/her understanding and acceptance.     Plan Discussed with:   Anesthesia Plan Comments:         Anesthesia Quick Evaluation

## 2017-01-12 NOTE — Transfer of Care (Signed)
Immediate Anesthesia Transfer of Care Note  Patient: Misty Bradshaw  Procedure(s) Performed: Procedure(s): CESAREAN SECTION (N/A)  Patient Location: PACU  Anesthesia Type:Spinal  Level of Consciousness: awake, alert  and oriented  Airway & Oxygen Therapy: Patient Spontanous Breathing  Post-op Assessment: Report given to RN and Post -op Vital signs reviewed and stable  Post vital signs: Reviewed and stable  Last Vitals:  Vitals:   01/12/17 0752 01/12/17 1132  BP: 122/67 120/69  Pulse: 93 82  Resp: 18   Temp: 36.5 C 36.1 C    Last Pain:  Vitals:   01/12/17 1132  TempSrc: Temporal  PainSc:          Complications: No apparent anesthesia complications

## 2017-01-12 NOTE — Progress Notes (Signed)
Spoke to Misty Bradshaw MDA about additional polycitrate order because pt received dose at 0910. Surgery delayed due to equipment. Kephart states not to give another dose of oracit. Waiting to go back for surgery now.

## 2017-01-12 NOTE — Op Note (Signed)
Preoperative Diagnosis: 1) 31 y.o. G2P0101 at 6989w5d 2) History of prior cesarean section desiring repeat  Postoperative Diagnosis: 1) 31 y.o. [id number] delivered at 789w5d 2) History of prior cesarean section desiredrepeat  Operation Performed: Repeat low transverse C-section via pfannenstiel skin incision  Indiciation: History of prior cesarean section desiring repeat  Anesthesia: Spinal  Primary Surgeon: Vena AustriaAndreas Ksenia Kunz, MD   Preoperative Antibiotics: 2g ancef  Estimated Blood Loss: 600mL  IV Fluids: 700mL  Urine Output:: pending  Drains or Tubes: Foley to gravity drainage, ON-Q catheter system  Implants: none  Specimens Removed: none  Complications: none  Intraoperative Findings:  Normal tubes ovaries and uterus.  Delivery resulted in the birth of a liveborn female, APGAR (1 MIN): 8, APGAR (5 MINS): 9, weight 6lbs 6oz  Patient Condition:stable  Procedure in Detail:  Patient was taken to the operating room were she was administered regional anesthesia.  She was positioned in the supine position, prepped and draped in the  Usual sterile fashion.  Prior to proceeding with the case a time out was performed and the level of anesthetic was checked and noted to be adequate.  Utilizing the scalpel a pfannenstiel skin incision was made 2cm above the pubic symphysis utilizing the patient's pre-existing scar and carried down sharply to the the level of the rectus fascia.  The fascia was incised in the midline using the scalpel and then extended using mayo scissors.  The superior border of the rectus fascia was grasped with two Kocher clamps and the underlying rectus muscles were dissected of the fascia using blunt dissection.  The median raphae was incised using Mayo scissors.   The inferior border of the rectus fascia was dissected of the rectus muscles in a similar fashion.  The midline was identified, the peritoneum was entered bluntly and expanded using manual tractions.  The uterus  was noted to be in a none rotated position.  Next the bladder blade was placed retracting the bladder caudally.  A bladder flap was not created.  A low transverse incision was scored on the lower uterine segment.  The hysterotomy was entered bluntly using the operators finger.  The hysterotomy incision was extended using manual traction.  The operators hand was placed within the hysterotomy position noting the fetus to be within the OA position.  The vertex was grasped, flexed, brought to the incision, and delivered a traumatically using fundal pressure.  The remainder of the body delivered with ease.  The infant was suctioned, cord was clamped and cut before handing off to the awaiting neonatologist.  The placenta was delivered using manual extraction.  The uterus was exteriorized, wiped clean of clots and debris using two moist laps.  The hysterotomy was closed using a two layer closure of 0 Vicryl, with the first being a running locked, the second a vertical imbricating.  The uterus was returned to the abdomen.  The peritoneal gutters were wiped clean of clots and debris using two moist laps.  The hysterotomy incision was re-inspected noted to be hemostatic.  The rectus muscles were inspected noted to be hemostatic.  The superior border of the rectus fascia was grasped with a Kocher clamp.  The ON-Q trocars were then placed 4cm above the superior border of the incision and tunneled subfascially.  The introducers were removed and the catheters were threaded through the sleeves after which the sleeves were removed.  The fascia was closed using a looped #1 PDS in a running fashion taking 1cm by 1cm bites.  The  subcutaneous tissue was irrigated using warm saline, hemostasis achieved using the bovie.  The subcutaneous dead space was less than 3cm and was not closed.  The skin was closed using 4-0 Monocryl in a subcuticular fashion.  Sponge needle and instrument counts were corrects times two.  The patient tolerated  the procedure well and was taken to the recovery room in stable condition.

## 2017-01-12 NOTE — Anesthesia Post-op Follow-up Note (Cosign Needed)
Anesthesia QCDR form completed.        

## 2017-01-12 NOTE — Anesthesia Procedure Notes (Signed)
Date/Time: 01/12/2017 10:15 AM Performed by: Karoline CaldwellSTARR, Misty Bradshaw Pre-anesthesia Checklist: Patient identified, Suction available and Patient being monitored Oxygen Delivery Method: Nasal cannula

## 2017-01-12 NOTE — Discharge Summary (Signed)
Obstetric Discharge Summary Reason for Admission: cesarean section Prenatal Procedures: none Intrapartum Procedures: cesarean: low cervical, transverse Postpartum Procedures: none Complications-Operative and Postpartum: none Hemoglobin  Date Value Ref Range Status  01/13/2017 10.7 (L) 12.0 - 16.0 g/dL Final   HGB  Date Value Ref Range Status  06/29/2014 13.8 12.0 - 16.0 g/dL Final   HCT  Date Value Ref Range Status  01/13/2017 30.6 (L) 35.0 - 47.0 % Final  06/29/2014 40.8 35.0 - 47.0 % Final    Physical Exam:  General: alert, appears stated age and no distress Lochia: appropriate Uterine Fundus: firm Incision: healing well DVT Evaluation: No evidence of DVT seen on physical exam.  Discharge Diagnoses: Term Pregnancy-delivered  Discharge Information: Date: 01/14/2017 Activity: pelvic rest and no lifting >10lbs for 6 weeks, no driving for 2 weeks, no baths showers ok Diet: routine Allergies as of 01/14/2017      Reactions   Flagyl [metronidazole] Hives      Medication List    STOP taking these medications   acetaminophen 500 MG tablet Commonly known as:  TYLENOL   HYDROcodone-acetaminophen 5-325 MG tablet Commonly known as:  NORCO/VICODIN   lithium 300 MG tablet   naproxen 500 MG tablet Commonly known as:  NAPROSYN   ranitidine 150 MG capsule Commonly known as:  ZANTAC   sucralfate 1 g tablet Commonly known as:  CARAFATE     TAKE these medications   calcium carbonate 500 MG chewable tablet Commonly known as:  TUMS - dosed in mg elemental calcium Chew 1 tablet by mouth 2 (two) times daily as needed for indigestion or heartburn.   docusate sodium 100 MG capsule Commonly known as:  COLACE Take 1 tablet once or twice daily as needed for constipation while taking narcotic pain medicine   ibuprofen 600 MG tablet Commonly known as:  ADVIL,MOTRIN Take 1 tablet (600 mg total) by mouth every 6 (six) hours as needed for mild pain.   oxyCODONE-acetaminophen  5-325 MG tablet Commonly known as:  PERCOCET/ROXICET Take 2 tablets by mouth every 4 (four) hours as needed (pain scale > 7).   prenatal multivitamin Tabs tablet Take 1 tablet by mouth daily at 12 noon.       Condition: stable Discharge to: home Follow-up Information    Carrye Goller, Florina OuANDREAS M, MD Follow up in 1 week(s).   Specialty:  Obstetrics and Gynecology Why:  For wound re-check Contact information: [address] [place] [address] [phone]           Newborn Data: Live born female  Birth Weight: 6 lb 6.3 oz (2900 g) APGAR: 8, 9  Home with mother.  Lennette Fader M 01/14/2017, 9:35 AM

## 2017-01-13 ENCOUNTER — Encounter: Payer: Self-pay | Admitting: Obstetrics and Gynecology

## 2017-01-13 LAB — CBC
HCT: 30.6 % — ABNORMAL LOW (ref 35.0–47.0)
Hemoglobin: 10.7 g/dL — ABNORMAL LOW (ref 12.0–16.0)
MCH: 31.6 pg (ref 26.0–34.0)
MCHC: 34.8 g/dL (ref 32.0–36.0)
MCV: 90.8 fL (ref 80.0–100.0)
PLATELETS: 217 10*3/uL (ref 150–440)
RBC: 3.37 MIL/uL — ABNORMAL LOW (ref 3.80–5.20)
RDW: 13.3 % (ref 11.5–14.5)
WBC: 16.8 10*3/uL — AB (ref 3.6–11.0)

## 2017-01-13 MED ORDER — IBUPROFEN 600 MG PO TABS
600.0000 mg | ORAL_TABLET | Freq: Four times a day (QID) | ORAL | Status: DC
  Administered 2017-01-13 – 2017-01-14 (×3): 600 mg via ORAL
  Filled 2017-01-13 (×3): qty 1

## 2017-01-13 NOTE — Progress Notes (Signed)
  Subjective:   Doing well Post op day 1, tolerating PO intake and pain is well controlled with PO medication and OnQ pump, ambulating and voiding without difficulty, pt tried breastfeeding but has switched to bottle  Objective:  Blood pressure (!) 97/59, pulse 62, temperature 98 F (36.7 C), temperature source Oral, resp. rate 17, height 5\' 6"  (1.676 m), weight 233 lb (105.7 kg), last menstrual period 05/25/2016, SpO2 100 %  General: NAD Pulmonary: no increased work of breathing Abdomen: non-distended, non-tender, fundus firm at level of umbilicus Incision: C/D/I no s/s infection, waffle dressing Extremities: no edema, no erythema, no tenderness  Results for orders placed or performed during the hospital encounter of 01/12/17 (from the past 24 hour(s))  Urine Drug Screen, Qualitative ([facility] only)     Status: Abnormal   Collection Time: 01/12/17  9:25 PM  Result Value Ref Range   Tricyclic, Ur Screen NONE DETECTED NONE DETECTED   Amphetamines, Ur Screen NONE DETECTED NONE DETECTED   MDMA (Ecstasy)Ur Screen NONE DETECTED NONE DETECTED   Cocaine Metabolite,Ur Ray NONE DETECTED NONE DETECTED   Opiate, Ur Screen NONE DETECTED NONE DETECTED   Phencyclidine (PCP) Ur S NONE DETECTED NONE DETECTED   Cannabinoid 50 Ng, Ur [place] POSITIVE (A) NONE DETECTED   Barbiturates, Ur Screen NONE DETECTED NONE DETECTED   Benzodiazepine, Ur Scrn NONE DETECTED NONE DETECTED   Methadone Scn, Ur NONE DETECTED NONE DETECTED  CBC     Status: Abnormal   Collection Time: 01/13/17  6:35 AM  Result Value Ref Range   WBC 16.8 (H) 3.6 - 11.0 K/uL   RBC 3.37 (L) 3.80 - 5.20 MIL/uL   Hemoglobin 10.7 (L) 12.0 - 16.0 g/dL   HCT 04.530.6 (L) 40.935.0 - 81.147.0 %   MCV 90.8 80.0 - 100.0 fL   MCH 31.6 26.0 - 34.0 pg   MCHC 34.8 32.0 - 36.0 g/dL   RDW 91.413.3 78.211.5 - 95.614.5 %   Platelets 217 150 - 440 K/uL     Assessment:   31 y.o. [id number] postoperativeday # 1   Plan:  1) Acute blood loss anemia - hemodynamically stable and  asymptomatic - po ferrous sulfate  2) B+, Rubella Immune, Varicella Immune  3) TDAP status: UTD   4) Bottle/Contraception: plans BCPs  5) Disposition: day of discharge to home depends on PP progress   Laveah Gloster, CNM

## 2017-01-13 NOTE — Clinical Social Work Maternal (Signed)
  CLINICAL SOCIAL WORK MATERNAL/CHILD NOTE  Patient Details  Name: Misty Bradshaw MRN: [id number] Date of Birth: 1985-12-31  Date:  01/13/2017  Clinical Social Worker Initiating Note:  Shela Leff MSW,LCSW Date/ Time Initiated:  01/13/17/      Child's Name:      Legal Guardian:  Mother   Need for Interpreter:  None   Date of Referral:  01/13/17     Reason for Referral:  Current Substance Use/Substance Use During Pregnancy    Referral Source:  RN   Address:     Phone number:      Household Members:  Minor Children, Self   Natural Supports (not living in the home):  Immediate Family   Professional Supports: None   Employment:     Type of Work:     Education:      Pensions consultant:  [employer]   Other Resources:  Physicist, medical , [employer], [employer]    Cultural/Religious Considerations Which May Impact Care:  none  Strengths:  Ability to meet basic needs , Home prepared for child    Risk Factors/Current Problems:  Substance Use    Cognitive State:  Alert    Mood/Affect:  Irritable    CSW Assessment: CSW received consult due to patient and her newborn testing positive for marijuana. CSW met with patient who was initially quite irritable because she stated she wanted to sleep. CSW explained that our discussion could be brief. CSW explained role and purpose of visit. Patient stated that she has all necessities for her newborn and has no concerns regarding transportation, financial or otherwise. Patient reports that she smoked marijuana to curb her nausea. CSW explained that due to both her and her newborn infant testing positive for marijuana, a DSS CPS report will need to be made. Patient verbalized understanding and had no further questions.  CSW Plan/Description:  Child Protective Service Report     Shela Leff, [place] 01/13/2017, 3:24 PM

## 2017-01-13 NOTE — Anesthesia Post-op Follow-up Note (Signed)
  Anesthesia Pain Follow-up Note  Patient: Misty Bradshaw  Day #: 1  Date of Follow-up: 01/13/2017 Time: 7:53 AM  Last Vitals:  Vitals:   01/13/17 0109 01/13/17 0558  BP: (!) 100/51 100/60  Pulse: 62 81  Resp: 18 18  Temp: 36.6 C 36.8 C    Level of Consciousness: alert  Pain: none   Side Effects:None  Catheter Site Exam:clean, dry, no drainage     Plan: D/C from anesthesia care at surgeon's request  Starling MannsCurtis,  Annison Birchard A

## 2017-01-13 NOTE — Anesthesia Postprocedure Evaluation (Signed)
Anesthesia Post Note  Patient: Misty Bradshaw  Procedure(s) Performed: Procedure(s) (LRB): CESAREAN SECTION (N/A)  Patient location during evaluation: Mother Baby Anesthesia Type: Spinal Level of consciousness: oriented and awake and alert Pain management: pain level controlled Vital Signs Assessment: post-procedure vital signs reviewed and stable Respiratory status: spontaneous breathing, respiratory function stable and patient connected to nasal cannula oxygen Cardiovascular status: blood pressure returned to baseline and stable Postop Assessment: no headache and no backache Anesthetic complications: no     Last Vitals:  Vitals:   01/13/17 0109 01/13/17 0558  BP: (!) 100/51 100/60  Pulse: 62 81  Resp: 18 18  Temp: 36.6 C 36.8 C    Last Pain:  Vitals:   01/13/17 0558  TempSrc: Oral  PainSc:                  Starling Mannsurtis,  Hamna Asa A

## 2017-01-13 NOTE — Clinical Social Work Note (Signed)
[facility] [place] SpanielMonica Cloyce Paterson MSW,LCSW [phone]

## 2017-01-14 MED ORDER — OXYCODONE-ACETAMINOPHEN 5-325 MG PO TABS: 2.0000 | ORAL_TABLET | ORAL | 0 refills | Status: DC | PRN

## 2017-01-14 MED ORDER — IBUPROFEN 600 MG PO TABS: 600.0000 mg | ORAL_TABLET | Freq: Four times a day (QID) | ORAL | 0 refills | Status: DC | PRN

## 2017-01-14 NOTE — Discharge Instructions (Signed)

## 2017-01-14 NOTE — Progress Notes (Signed)
Patient understands all discharge instructions and the need to make follow up appointments. Provided patient written and verbal instructions on the proper care and removal of On-Q pump, patient verbalized understanding. Patient discharge via wheelchair with auxillary.

## 2017-02-28 ENCOUNTER — Ambulatory Visit (INDEPENDENT_AMBULATORY_CARE_PROVIDER_SITE_OTHER): Payer: Self-pay | Admitting: Obstetrics and Gynecology

## 2017-02-28 DIAGNOSIS — Z538 Procedure and treatment not carried out for other reasons: Secondary | ICD-10-CM

## 2017-02-28 LAB — HM PAP SMEAR: HM PAP: NEGATIVE

## 2017-12-15 LAB — HM PAP SMEAR: HM PAP: NEGATIVE

## 2018-09-06 LAB — HM HIV SCREENING LAB: HM HIV SCREENING: NEGATIVE

## 2019-03-03 ENCOUNTER — Other Ambulatory Visit: Payer: Self-pay

## 2019-03-03 ENCOUNTER — Encounter: Payer: Self-pay | Admitting: Emergency Medicine

## 2019-03-03 ENCOUNTER — Emergency Department
Admission: EM | Admit: 2019-03-03 | Discharge: 2019-03-03 | Disposition: A | Discharge status: Home / Self Care | Payer: Self-pay | Attending: Emergency Medicine | Admitting: Emergency Medicine

## 2019-03-03 ENCOUNTER — Emergency Department: Payer: Self-pay

## 2019-03-03 DIAGNOSIS — Y999 Unspecified external cause status: Secondary | ICD-10-CM | POA: Insufficient documentation

## 2019-03-03 DIAGNOSIS — F17210 Nicotine dependence, cigarettes, uncomplicated: Secondary | ICD-10-CM | POA: Insufficient documentation

## 2019-03-03 DIAGNOSIS — W25XXXA Contact with sharp glass, initial encounter: Secondary | ICD-10-CM | POA: Insufficient documentation

## 2019-03-03 DIAGNOSIS — Y92009 Unspecified place in unspecified non-institutional (private) residence as the place of occurrence of the external cause: Secondary | ICD-10-CM

## 2019-03-03 DIAGNOSIS — S81811A Laceration without foreign body, right lower leg, initial encounter: Secondary | ICD-10-CM | POA: Insufficient documentation

## 2019-03-03 DIAGNOSIS — Y92003 Bedroom of unspecified non-institutional (private) residence as the place of occurrence of the external cause: Secondary | ICD-10-CM | POA: Insufficient documentation

## 2019-03-03 DIAGNOSIS — W19XXXA Unspecified fall, initial encounter: Secondary | ICD-10-CM

## 2019-03-03 DIAGNOSIS — Y9389 Activity, other specified: Secondary | ICD-10-CM | POA: Insufficient documentation

## 2019-03-03 MED ORDER — LIDOCAINE HCL (PF) 1 % IJ SOLN
15.0000 mL | Freq: Once | INTRAMUSCULAR | Status: AC
  Administered 2019-03-03: 15 mL
  Filled 2019-03-03: qty 15

## 2019-03-03 MED ORDER — CEPHALEXIN 500 MG PO CAPS: 500.0000 mg | ORAL_CAPSULE | Freq: Three times a day (TID) | ORAL | 0 refills | Status: DC

## 2019-03-03 MED ORDER — CEPHALEXIN 500 MG PO CAPS
500.0000 mg | ORAL_CAPSULE | Freq: Once | ORAL | Status: AC
  Administered 2019-03-03: 10:00:00 500 mg via ORAL
  Filled 2019-03-03: qty 1

## 2019-03-03 MED ORDER — IBUPROFEN 600 MG PO TABS: 600.0000 mg | ORAL_TABLET | Freq: Three times a day (TID) | ORAL | 0 refills | Status: DC | PRN

## 2019-03-03 NOTE — ED Notes (Addendum)
See triage note  States she fell on to glass table about 3 am on Saturday morning  Bruising noted to left upper arm and leg  Superficial lacerations noted to right arm  2 lacerations noted to right lower leg  No active bleeding noted  dried blood noted to lower leg and right hand

## 2019-03-03 NOTE — Discharge Instructions (Signed)
Begin taking antibiotics as directed to prevent infection.  Also clean the area with mild soap and water once or twice a day, be sure to get this area completely dry after cleaning it.  Watch for any signs of infection.  You will need to call your primary care provider to see if they are able to remove the sutures in 12 days.  You may also consider going to an urgent care if you are not able to get your sutures removed by your primary care to decrease your risk for coronavirus exposure.

## 2019-03-03 NOTE — ED Provider Notes (Signed)
[facility] Emergency Department Provider Note   ____________________________________________   First MD Initiated Contact with Patient 03/03/19 [phone]     (approximate)  I have reviewed the triage vital signs and the nursing notes.   HISTORY  Chief Complaint Fall and Laceration   HPI Misty Bradshaw is a 33 y.o. female presents to the ED with complaint of lacerations to her right leg.  Patient states that she fell onto a glass table that broke when she jumped onto her bed and was "bounced off".  Patient states that the accident occurred at 3 AM on Sunday morning.  She thought that this would heal with placing Band-Aids which it has not.  Patient reports that her tetanus is up-to-date.  She rates her pain as an 8 out of 10.     Past Medical History:  Diagnosis Date  . Anxiety   . Anxiety 09/17/2014  . Bipolar 1 disorder (HCC)   . Depression    not currently  . Depression 09/17/2014  . Dyspnea   . Enlarged thyroid   . History of abnormal cervical Pap smear 03/07/2005    Patient Active Problem List   Diagnosis Date Noted  . History of cesarean delivery, currently pregnant 01/12/2017  . Substance induced mood disorder (HCC) 09/11/2015  . Alcohol abuse 09/11/2015  . Bipolar disorder, current episode hypomanic (HCC) 09/04/2015  . Cannabis abuse 09/04/2015  . Homelessness 09/04/2015    Past Surgical History:  Procedure Laterality Date  . CESAREAN SECTION    . CESAREAN SECTION N/A 01/12/2017   Procedure: CESAREAN SECTION;  Surgeon: Vena Austria, MD;  Location: [facility] ORS;  Service: Obstetrics;  Laterality: N/A;    Prior to Admission medications   Medication Sig Start Date End Date Taking? Authorizing Provider  cephALEXin (KEFLEX) 500 MG capsule Take 1 capsule (500 mg total) by mouth 3 (three) times daily. 03/03/19   Tommi Rumps, PA-C  ibuprofen (ADVIL,MOTRIN) 600 MG tablet Take 1 tablet (600 mg total) by mouth every 8 (eight) hours as needed  for mild pain. 03/03/19   Tommi Rumps, PA-C    Allergies Flagyl [metronidazole]  Family History  Problem Relation Age of Onset  . Lung cancer Paternal Grandmother   . Diabetes Maternal Grandmother   . Cancer Maternal Grandfather     Social History Social History   Tobacco Use  . Smoking status: Current Some Day Smoker    Packs/day: 0.25    Types: Cigarettes  . Smokeless tobacco: Never Used  Substance Use Topics  . Alcohol use: No  . Drug use: No    Review of Systems Constitutional: No fever/chills Eyes: No visual changes. ENT: No trauma. Cardiovascular: Denies chest pain. Respiratory: Denies shortness of breath. Gastrointestinal: No abdominal pain.  No nausea, no vomiting.  Musculoskeletal: Negative for back pain. Skin: Positive for lacerations right leg x2 Neurological: Negative for headaches, focal weakness or numbness. ____________________________________________   PHYSICAL EXAM:  VITAL SIGNS: ED Triage Vitals  Enc Vitals Group     BP 03/03/19 0929 128/76     Pulse Rate 03/03/19 0929 64     Resp 03/03/19 0929 18     Temp 03/03/19 0929 97.9 F (36.6 C)     Temp Source 03/03/19 0929 Oral     SpO2 03/03/19 0929 97 %     Weight 03/03/19 0929 215 lb (97.5 kg)     Height 03/03/19 0929 5\' 6"  (1.676 m)     Head Circumference --  Peak Flow --      Pain Score 03/03/19 0926 8     Pain Loc --      Pain Edu? --      Excl. in GC? --     Constitutional: Alert and oriented. Well appearing and in no acute distress. Eyes: Conjunctivae are normal. PERRL. EOMI. Head: Atraumatic. Neck: No stridor.   Cardiovascular: Normal rate, regular rhythm. Grossly normal heart sounds.  Good peripheral circulation. Respiratory: Normal respiratory effort.  No retractions. Lungs CTAB. Musculoskeletal: Patient is able move upper and lower extremities without any difficulty and is ambulatory without any assistance. Neurologic:  Normal speech and language. No gross focal  neurologic deficits are appreciated. No gait instability. Skin:  Skin is warm, dry.  There are 2 gaping lacerations to the posterior aspect of the right lower extremity.  No active bleeding is present but dried blood is surrounding and also down her leg.  No evidence of infection or foreign body was noted. Psychiatric: Mood and affect are normal. Speech and behavior are normal.  ____________________________________________   LABS (all labs ordered are listed, but only abnormal results are displayed)  Labs Reviewed - No data to display  RADIOLOGY  Official radiology report(s): Dg Tibia/fibula Right  Result Date: 03/03/2019 CLINICAL DATA:  Fall on the glass table on Friday.  Lacerations. EXAM: RIGHT TIBIA AND FIBULA - 2 VIEW COMPARISON:  None. FINDINGS: Patchy soft tissue gas lateral to the knee. No opaque foreign body or fracture. IMPRESSION: Soft tissue injury without opaque foreign body or fracture. Electronically Signed   By: Marnee Spring M.D.   On: 03/03/2019 10:08    ____________________________________________   PROCEDURES  Procedure(s) performed (including Critical Care):  Marland KitchenMarland KitchenLaceration Repair Date/Time: 03/03/2019 10:53 AM Performed by: Tommi Rumps, PA-C Authorized by: Tommi Rumps, PA-C   Consent:    Consent obtained:  Verbal   Consent given by:  Patient   Risks discussed:  Poor wound healing and infection Anesthesia (see MAR for exact dosages):    Anesthesia method:  Local infiltration   Local anesthetic:  Lidocaine 1% w/o epi Laceration details:    Location:  Leg   Leg location:  R lower leg   Length (cm):  5 Repair type:    Repair type:  Simple Pre-procedure details:    Preparation:  Patient was prepped and draped in usual sterile fashion and imaging obtained to evaluate for foreign bodies Exploration:    Contaminated: no   Treatment:    Area cleansed with:  Saline   Amount of cleaning:  Extensive   Irrigation solution:  Sterile saline    Irrigation volume:  30 cc   Irrigation method:  Syringe   Visualized foreign bodies/material removed: no   Skin repair:    Repair method:  Sutures   Suture size:  4-0   Suture material:  Nylon   Suture technique:  Simple interrupted   Number of sutures:  6 Approximation:    Approximation:  Loose Post-procedure details:    Dressing:  Non-adherent dressing   Patient tolerance of procedure:  Tolerated well, no immediate complications .Marland KitchenLaceration Repair Date/Time: 03/03/2019 10:55 AM Performed by: Tommi Rumps, PA-C Authorized by: Tommi Rumps, PA-C   Consent:    Consent obtained:  Verbal   Consent given by:  Patient   Risks discussed:  Infection and poor wound healing Anesthesia (see MAR for exact dosages):    Anesthesia method:  Local infiltration   Local anesthetic:  Lidocaine 1% w/o  epi Laceration details:    Location:  Leg   Leg location:  R lower leg   Length (cm):  3.5 Repair type:    Repair type:  Simple Pre-procedure details:    Preparation:  Patient was prepped and draped in usual sterile fashion and imaging obtained to evaluate for foreign bodies Exploration:    Contaminated: no   Treatment:    Area cleansed with:  Saline   Amount of cleaning:  Extensive   Irrigation solution:  Sterile saline   Irrigation volume:  30 cc   Irrigation method:  Syringe   Visualized foreign bodies/material removed: no   Skin repair:    Repair method:  Sutures   Suture size:  4-0   Suture material:  Nylon   Suture technique:  Simple interrupted   Number of sutures:  5 Approximation:    Approximation:  Loose Post-procedure details:    Dressing:  Non-adherent dressing   Patient tolerance of procedure:  Tolerated well, no immediate complications   ____________________________________________   INITIAL IMPRESSION / ASSESSMENT AND PLAN / ED COURSE  As part of my medical decision making, I reviewed the following data within the electronic MEDICAL RECORD NUMBER Notes from  prior ED visits and [place] Controlled Substance Database  33 year old female presents to the ED with lacerations to her right leg that occurred at home secondary to being "bounced off her bed and hitting a glass table".  Lacerations are greater than 10 hours old.  Patient is aware that these will be sutured loosely to allow for possible infection and she was given Keflex 500 mg p.o. while in the ED.  Patient was given a prescription to continue taking Keflex.  She was given instructions on how to care for these areas and watch for any signs of infection.  Patient was ambulatory at the time of discharge without any difficulties.  She will also check and see if her PCP is able to take her sutures out to prevent returning to the ED.  ____________________________________________   FINAL CLINICAL IMPRESSION(S) / ED DIAGNOSES  Final diagnoses:  Lacerations of multiple sites of right leg, initial encounter  Fall in home, initial encounter     ED Discharge Orders         Ordered    cephALEXin (KEFLEX) 500 MG capsule  3 times daily     03/03/19 1046    ibuprofen (ADVIL,MOTRIN) 600 MG tablet  Every 8 hours PRN     03/03/19 1046           Note:  This document was prepared using Dragon voice recognition software and may include unintentional dictation errors.    Tommi Rumps, PA-C 03/03/19 1639    Emily Filbert, MD 03/04/19 [phone]

## 2019-03-03 NOTE — ED Triage Notes (Signed)
Pt to ED via POV, pt states that she fell and cut her right leg on a glass table. Pt is in NAD.

## 2019-06-18 ENCOUNTER — Ambulatory Visit: Payer: Self-pay

## 2019-06-25 ENCOUNTER — Ambulatory Visit: Payer: Self-pay

## 2020-02-17 ENCOUNTER — Ambulatory Visit: Payer: Self-pay | Attending: Internal Medicine

## 2020-04-24 ENCOUNTER — Emergency Department
Admission: EM | Admit: 2020-04-24 | Discharge: 2020-04-24 | Disposition: A | Discharge status: Home / Self Care | Payer: Self-pay | Attending: Emergency Medicine | Admitting: Emergency Medicine

## 2020-04-24 ENCOUNTER — Encounter: Payer: Self-pay | Admitting: Emergency Medicine

## 2020-04-24 ENCOUNTER — Other Ambulatory Visit: Payer: Self-pay

## 2020-04-24 ENCOUNTER — Emergency Department: Payer: Self-pay

## 2020-04-24 DIAGNOSIS — F17210 Nicotine dependence, cigarettes, uncomplicated: Secondary | ICD-10-CM | POA: Insufficient documentation

## 2020-04-24 DIAGNOSIS — R519 Headache, unspecified: Secondary | ICD-10-CM | POA: Insufficient documentation

## 2020-04-24 LAB — POCT PREGNANCY, URINE: Preg Test, Ur: NEGATIVE

## 2020-04-24 MED ORDER — KETOROLAC TROMETHAMINE 30 MG/ML IJ SOLN
30.0000 mg | Freq: Once | INTRAMUSCULAR | Status: AC
  Administered 2020-04-24: 30 mg via INTRAMUSCULAR
  Filled 2020-04-24: qty 1

## 2020-04-24 MED ORDER — METOCLOPRAMIDE HCL 5 MG/ML IJ SOLN
10.0000 mg | Freq: Once | INTRAMUSCULAR | Status: AC
  Administered 2020-04-24: 10 mg via INTRAVENOUS
  Filled 2020-04-24: qty 2

## 2020-04-24 MED ORDER — DIPHENHYDRAMINE HCL 50 MG/ML IJ SOLN
25.0000 mg | Freq: Once | INTRAMUSCULAR | Status: AC
  Administered 2020-04-24: 25 mg via INTRAVENOUS
  Filled 2020-04-24: qty 1

## 2020-04-24 MED ORDER — SODIUM CHLORIDE 0.9 % IV BOLUS
1000.0000 mL | Freq: Once | INTRAVENOUS | Status: AC
  Administered 2020-04-24: 1000 mL via INTRAVENOUS

## 2020-04-24 NOTE — ED Notes (Signed)
Last menstrual cycle started April 17th

## 2020-04-24 NOTE — ED Provider Notes (Signed)
[facility] Emergency Department Provider Note  ____________________________________________   First MD Initiated Contact with Patient 04/24/20 1033     (approximate)  I have reviewed the triage vital signs and the nursing notes.   HISTORY  Chief Complaint Migraine   HPI Senya Hinzman is a 34 y.o. female presents to the ED with complaint of right-sided headache for the last 4 days.  Patient states she has a history of headaches in the past but they only last 10 to 20 minutes.  She states that she currently has light sensitivity with some nausea but reports no vomiting.  There has been no fever, chills or nasal congestion.  Patient has not taken any over-the-counter medications.  She rates her pain as a 10/10.       Past Medical History:  Diagnosis Date  . Anxiety   . Anxiety 09/17/2014  . Bipolar 1 disorder (HCC)   . Depression    not currently  . Depression 09/17/2014  . Dyspnea   . Enlarged thyroid   . History of abnormal cervical Pap smear 03/07/2005    Patient Active Problem List   Diagnosis Date Noted  . History of cesarean delivery, currently pregnant 01/12/2017  . Substance induced mood disorder (HCC) 09/11/2015  . Alcohol abuse 09/11/2015  . Bipolar disorder, current episode hypomanic (HCC) 09/04/2015  . Cannabis abuse 09/04/2015  . Homelessness 09/04/2015    Past Surgical History:  Procedure Laterality Date  . CESAREAN SECTION    . CESAREAN SECTION N/A 01/12/2017   Procedure: CESAREAN SECTION;  Surgeon: Vena Austria, MD;  Location: [facility] ORS;  Service: Obstetrics;  Laterality: N/A;    Prior to Admission medications   Not on File    Allergies Flagyl [metronidazole]  Family History  Problem Relation Age of Onset  . Lung cancer Paternal Grandmother   . Diabetes Maternal Grandmother   . Cancer Maternal Grandfather     Social History Social History   Tobacco Use  . Smoking status: Current Some Day Smoker   Packs/day: 0.25    Types: Cigarettes  . Smokeless tobacco: Never Used  Substance Use Topics  . Alcohol use: No  . Drug use: No    Review of Systems Constitutional: No fever/chills Eyes: Positive for photophobia. ENT: No complaints. Cardiovascular: Denies chest pain. Respiratory: Denies shortness of breath.  Negative for cough. Gastrointestinal: No abdominal pain.  Positive nausea, no vomiting.   Genitourinary: Negative for dysuria. Musculoskeletal: Negative for back pain. Skin: Negative for rash. Neurological: Positive for headaches, negative focal weakness or numbness.   ____________________________________________   PHYSICAL EXAM:  VITAL SIGNS: ED Triage Vitals  Enc Vitals Group     BP --      Pulse Rate 04/24/20 1042 90     Resp --      Temp 04/24/20 1042 98.3 F (36.8 C)     Temp Source 04/24/20 1042 Oral     SpO2 04/24/20 1042 100 %     Weight 04/24/20 1008 215 lb (97.5 kg)     Height 04/24/20 1008 5\' 6"  (1.676 m)     Head Circumference --      Peak Flow --      Pain Score 04/24/20 1008 10     Pain Loc --      Pain Edu? --      Excl. in GC? --     Constitutional: Alert and oriented. Well appearing and in no acute distress. Eyes: Conjunctivae are normal. PERRL. EOMI. Head:  Atraumatic. Nose: No congestion/rhinnorhea. Neck: No stridor.   Cardiovascular: Normal rate, regular rhythm. Grossly normal heart sounds.  Good peripheral circulation. Respiratory: Normal respiratory effort.  No retractions. Lungs CTAB. Gastrointestinal: Soft and nontender. No distention. Musculoskeletal: Moves upper and lower extremities with any difficulty.  Normal gait was noted.  Good muscle strength bilaterally. Neurologic:  Normal speech and language.  Cranial nerves II through XII grossly intact.  No gross focal neurologic deficits are appreciated. No gait instability. Skin:  Skin is warm, dry and intact. No rash noted. Psychiatric: Mood and affect are normal. Speech and behavior  are normal.  ____________________________________________   LABS (all labs ordered are listed, but only abnormal results are displayed)  Labs Reviewed  POCT PREGNANCY, URINE  POC URINE PREG, ED     RADIOLOGY  Official radiology report(s): CT Head Wo Contrast  Result Date: 04/24/2020 CLINICAL DATA:  Headache, acute, normal neuro exam. Headache is atypical. Right-sided headache 4 days. EXAM: CT HEAD WITHOUT CONTRAST TECHNIQUE: Contiguous axial images were obtained from the base of the skull through the vertex without intravenous contrast. COMPARISON:  CT head without contrast 12/02/2015 FINDINGS: Brain: No acute infarct, hemorrhage, or mass lesion is present. No significant white matter lesions are present. The ventricles are of normal size. No significant extraaxial fluid collection is present. The brainstem and cerebellum are within normal limits. Vascular: No hyperdense vessel or unexpected calcification. Skull: Calvarium is intact. No focal lytic or blastic lesions are present. No significant extracranial soft tissue lesion is present. Sinuses/Orbits: The paranasal sinuses and mastoid air cells are clear. The globes and orbits are within normal limits. IMPRESSION: Negative CT of the head. Electronically Signed   By: San Morelle M.D.   On: 04/24/2020 11:53    ____________________________________________   PROCEDURES  Procedure(s) performed (including Critical Care):  Procedures   ____________________________________________   INITIAL IMPRESSION / ASSESSMENT AND PLAN / ED COURSE  As part of my medical decision making, I reviewed the following data within the electronic MEDICAL RECORD NUMBER Notes from prior ED visits and [place] Controlled Substance Database  34 year old female presents to the ED with complaint of right-sided headache for the last 4 days.  Patient has a history of headaches but states they only last for 10-20 minutes.  Patient has not taken any over-the-counter  medication for her headache as she thought that each day it would get better.  She reports that this headache is different in that it is right-sided.  She does not take any routine daily migraine medication.  CT scan was ordered due to patient reporting that this is different than her normal headache.  CT scan was unremarkable and patient improved with IV Benadryl, Reglan, and Toradol.  Patient was also given 1 L of normal saline.  Prior to discharge patient states that her headache is greatly improved.  Patient is encouraged to continue to drink fluids to stay hydrated.  She was also encouraged to take Tylenol or ibuprofen as needed.  She is to follow-up with her PCP if any continued problems. ____________________________________________   FINAL CLINICAL IMPRESSION(S) / ED DIAGNOSES  Final diagnoses:  Right-sided headache     ED Discharge Orders    None       Note:  This document was prepared using Dragon voice recognition software and may include unintentional dictation errors.    Johnn Hai, PA-C 04/24/20 1551    Blake Divine, MD 04/25/20 [phone]

## 2020-04-24 NOTE — Discharge Instructions (Addendum)
Follow-up with your primary care provider if any continued problems.  Continue to drink fluids to stay hydrated.  You may also take Tylenol or ibuprofen if needed over the weekend.  If any severe worsening of your symptoms or urgent concerns you will need to return to the emergency department or you can follow-up at [facility] acute care.

## 2020-04-24 NOTE — ED Triage Notes (Signed)
Pt c/o migraine for 4 days. Pt states hx of the same but this one is not going away. Pt reports sensitivity to light and nausea. Pt reports no relief with OTC meds.

## 2020-04-24 NOTE — ED Notes (Signed)
Late entry: 11am   Presents with h/a for about 4 days   States she is able to sleep   But h/a remains when she gets up  Denies any trauma or fever

## 2020-10-09 ENCOUNTER — Ambulatory Visit: Payer: Self-pay

## 2020-10-09 ENCOUNTER — Other Ambulatory Visit: Payer: Self-pay

## 2020-10-09 ENCOUNTER — Ambulatory Visit (LOCAL_COMMUNITY_HEALTH_CENTER): Payer: Medicaid Other | Admitting: Family Medicine

## 2020-10-09 VITALS — BP 127/76 | Ht 65.0 in | Wt 209.4 lb

## 2020-10-09 DIAGNOSIS — Z3009 Encounter for other general counseling and advice on contraception: Secondary | ICD-10-CM

## 2020-10-09 DIAGNOSIS — Z30013 Encounter for initial prescription of injectable contraceptive: Secondary | ICD-10-CM | POA: Diagnosis not present

## 2020-10-09 DIAGNOSIS — Z113 Encounter for screening for infections with a predominantly sexual mode of transmission: Secondary | ICD-10-CM

## 2020-10-09 DIAGNOSIS — G43909 Migraine, unspecified, not intractable, without status migrainosus: Secondary | ICD-10-CM | POA: Insufficient documentation

## 2020-10-09 DIAGNOSIS — Z01419 Encounter for gynecological examination (general) (routine) without abnormal findings: Secondary | ICD-10-CM | POA: Diagnosis not present

## 2020-10-09 LAB — WET PREP FOR TRICH, YEAST, CLUE
Trichomonas Exam: NEGATIVE
Yeast Exam: NEGATIVE

## 2020-10-09 LAB — PREGNANCY, URINE: Preg Test, Ur: NEGATIVE

## 2020-10-09 MED ORDER — MEDROXYPROGESTERONE ACETATE 150 MG/ML IM SUSP
150.0000 mg | INTRAMUSCULAR | Status: AC
  Administered 2020-10-09 – 2021-01-21 (×2): 150 mg via INTRAMUSCULAR

## 2020-10-09 NOTE — Progress Notes (Signed)
Pt to clinic for physical, STD checks, and depo start. Is currently taking antibiotic for infection on finger.

## 2020-10-09 NOTE — Progress Notes (Signed)
Wet mount reviewed, pt denies symptoms, no tx per standing order. Pt is not interested in ECP. Provider orders completed.

## 2020-10-09 NOTE — Progress Notes (Addendum)
[facility] [address] [place], [place] [address] Main Number: [phone]  Family Planning Visit- Initial Visit  Subjective:  Misty Bradshaw is a 34 y.o.  G2P1102  being seen today for an initial well woman visit and to discuss family planning options. Patient reports they do not want a pregnancy in the next year.   Chief Complaint  Patient presents with  . Annual Exam  . Contraception    desires depo    Pt has Bipolar disorder, current episode hypomanic (HCC); Cannabis abuse; Homelessness; Substance induced mood disorder (HCC); Alcohol abuse; History of cesarean delivery, currently pregnant; Enlarged thyroid gland; Epidermoid cyst; Hirsutism; Keratosis punctata; Low HDL (under 40); Psychotic disorder (HCC); Seasonal allergies; Secondary amenorrhea; Tobacco use disorder; and Migraines on their problem list.   HPI  Patient reports she is here for depo initiation, physical and STI check.   Pt denies all of the following, which are contraindications to Depo use: Known breast cancer Pregnancy Also denies: Hypertension (CDC cat 2 if mild, cat 3 if severe) Severe cirrhosis, hepatocellular adenoma Diabetes with nephrosis or vascular complications Ischemic heart disease or multiple risk factors for atherosclerotic disease, and some forms of lupus Unexplained vaginal bleeding Pregnancy planned within the next year Long-term use of corticosteroid therapy in women with a history of, or risk factors for, nontraumatic (frailty) fractures.  Current use of aminoglutethimide (usually for the treatment of Cushing's syndrome) because aminoglutethimide may increase metabolism of progestins    Patient's last menstrual period was 09/28/2020 (exact date). Last sex: 4 days ago with female BCM: no condoms Pt desires EC? Pt declines  Last pap per pt/review of record: 12/16/2017 = NILM, HPV neg. 2018 = neg/neg Updated in Epic Care Gaps. Last  HIV test per pt/review of record: 08/2018 Last tetanus vaccine: 1996? Covid vaccine: has had  Last breast exam: unsure Personal/family hx breast cancer? no  Patient reports 3 partner(s) in last year. Do they desire STI screening (if no, why not)? yes  Does the patient desire a pregnancy in the next year? no   34 y.o., Body mass index is 34.85 kg/m. - Is patient eligible for HA1C diabetes screening based on BMI and age >65?  no  HCV screening;       Has patient been screened once for HCV in the past?  no  No results found for: HCVAB      Does the patient have current drug use, have a partner with drug use, and/or has been incarcerated since last result? no If yes-- Screen for HCV through [facility]   Does the patient meet criteria for HBV testing? Yes - contact Criteria:  -Household, sexual or needle sharing contact with HBV -History of drug use -HIV positive -Those with known Hep C  PHQ-2 score 0 Deals with stress through music, cigarettes, talks with family.  See flowsheet for other program required questions.   Health Maintenance Due  Topic Date Due  . Hepatitis C Screening  Never done  . COVID-19 Vaccine (1) Never done  . TETANUS/TDAP  Never done  . INFLUENZA VACCINE  Never done    ROS 10 point review of systems is otherwise negative except as mentioned in HPI and listed below: Migraines: comes occasionally, denies aura Vaginal d/c: nothing out of ordinary Rash/growth/lesion: had trich 2 months ago, believes this caused a rash. Now resolved.  The following portions of the patient's history were reviewed and updated as appropriate: allergies, current medications, past family  history, past medical history, past social history, past surgical history and problem list. Problem list updated.   See flowsheet for other program required questions.  Objective:   Vitals:   10/09/20 1019  BP: 127/76  Weight: 209 lb 6.4 oz (95 kg)  Height: 5\' 5"  (1.651 m)     Physical Exam Vitals and nursing note reviewed.  Constitutional:      Appearance: Normal appearance.  HENT:     Head: Normocephalic and atraumatic.     Mouth/Throat:     Mouth: Mucous membranes are moist.     Pharynx: Oropharynx is clear. No oropharyngeal exudate or posterior oropharyngeal erythema.  Eyes:     Conjunctiva/sclera: Conjunctivae normal.  Neck:     Thyroid: No thyroid mass, thyromegaly or thyroid tenderness.  Cardiovascular:     Rate and Rhythm: Normal rate and regular rhythm.     Pulses: Normal pulses.     Heart sounds: Normal heart sounds.  Pulmonary:     Effort: Pulmonary effort is normal.     Breath sounds: Normal breath sounds.  Chest:     Breasts:        Right: Normal. No swelling, mass, nipple discharge, skin change or tenderness.        Left: Normal. No swelling, mass, nipple discharge, skin change or tenderness.  Abdominal:     General: Abdomen is flat.     Palpations: There is no mass.     Tenderness: There is no abdominal tenderness. There is no rebound.  Genitourinary:    General: Normal vulva.     Exam position: Lithotomy position.     Pubic Area: No rash or pubic lice.      Labia:            Right: No rash or lesion.            Left: No rash or lesion.      Vagina: Normal. No vaginal erythema, bleeding or lesions. Vaginal discharge: white, ph<4.5    Cervix: No cervical motion tenderness, discharge, friability, lesion or erythema.     Uterus: Normal.      Adnexa: Right adnexa normal and left adnexa normal.     Rectum: Normal.  Lymphadenopathy:     Head:     Right side of head: No preauricular or posterior auricular adenopathy.     Left side of head: No preauricular or posterior auricular adenopathy.     Cervical: No cervical adenopathy.     Upper Body:     Right upper body: No supraclavicular or axillary adenopathy.     Left upper body: No supraclavicular or axillary adenopathy.     Lower Body: No right inguinal adenopathy. No left  inguinal adenopathy.  Skin:    General: Skin is warm and dry.     Findings: No rash.  Neurological:     Mental Status: She is alert and oriented to person, place, and time.     Assessment and Plan:  Misty Bradshaw is a 34 y.o. female presenting to the [facility] Department for an initial well woman exam/family planning visit.  Contraception counseling: Reviewed all forms of birth control options in the tiered based approach. available including abstinence; over the counter/barrier methods; hormonal contraceptive medication including pill, patch, ring, injection,contraceptive implant, ECP; hormonal and nonhormonal IUDs; permanent sterilization options including vasectomy and the various tubal sterilization modalities. Risks, benefits, and typical effectiveness rates were reviewed.  Questions were answered.  Written information was also given to  the patient to review.  Patient desires depo, this was prescribed for patient. She will follow up in  3 months for surveillance.  She was told to call with any further questions, or with any concerns about this method of contraception.  Emphasized use of condoms 100% of the time for STI prevention.  Emergency Contraception: Pt was offered ECP. ECP was not accepted by pt.    1. Well woman exam -BCM: depo today -We discussed that since pt has had unprotected sex 4 ago, LMP 11 days ago she is at risk of pregnancy even though urine preg test is negative today. Discussed risks and benefits of following options: Depo today + home preg test in 10 days or ECP today, wait 10 days, RTC for preg test + Depo. She verbalizes understanding of risks and prefers to get Depo today, take home preg test in 10 days.  -Pt to use backup contraception for 2 weeks. Recommended condoms 100% of time for STI protection. -Pap: due in 2024 -CBE: done today. "Active FYIs" info up to date. Recommended screening mammograms beginning at age 88 -Hepatitis B/C screening: pt  qualifies and accepts both -Recommended PCP f/u for migraines - Pregnancy, urine - medroxyPROGESTERone (DEPO-PROVERA) injection 150 mg  3. Screening examination for venereal disease -Pt without symptoms. Screenings today as below. Treat wet prep per standing order, noting ALLERGY TO FLAGYL -Patient  meet criteria for HepB, HepC Screening. Accepts these screenings. -Counseled on warning s/sx and when to seek care. Recommended condom use with all sex and discussed importance of condom use for STI prevention. - WET PREP FOR TRICH, YEAST, CLUE - Chlamydia/Gonorrhea [place] [facility] - HBV Antigen/Antibody [facility] - HIV/HCV [place] - Syphilis Serology, [place] [facility]     Return in about 11 weeks (around 12/25/2020) for Depo.  No future appointments.  Ann Held, PA-C

## 2020-10-16 LAB — HM HIV SCREENING LAB: HM HIV Screening: NEGATIVE

## 2020-10-16 LAB — HM HEPATITIS C SCREENING LAB: HM Hepatitis Screen: NEGATIVE

## 2020-10-20 ENCOUNTER — Telehealth: Payer: Self-pay

## 2020-10-21 ENCOUNTER — Ambulatory Visit: Payer: Medicaid Other | Admitting: Physician Assistant

## 2020-10-21 ENCOUNTER — Other Ambulatory Visit: Payer: Self-pay

## 2020-10-21 DIAGNOSIS — A539 Syphilis, unspecified: Secondary | ICD-10-CM

## 2020-10-21 DIAGNOSIS — Z113 Encounter for screening for infections with a predominantly sexual mode of transmission: Secondary | ICD-10-CM | POA: Diagnosis not present

## 2020-10-21 MED ORDER — PENICILLIN G BENZATHINE 1200000 UNIT/2ML IM SUSP
2.4000 10*6.[IU] | INTRAMUSCULAR | Status: AC
Start: 2020-10-21 — End: 2020-11-04
  Administered 2020-10-21 – 2020-11-04 (×3): 2.4 10*6.[IU] via INTRAMUSCULAR

## 2020-10-21 NOTE — Progress Notes (Signed)
S:  Patient into clinic for treatment one of three for Syphilis.  States that she is only allergic to Flagyl.  Denies any symptoms. O:  WDWN female in NAD, A&O x 3, normal work of breathing.  RPR done 10/09/2020, was reactive 1:32, with reactive Syphilis TP. A/P:  1. Patient needs treatment for Syphilis of unknown duration. 2.  Reviewed with patient Syphilis- dz, s/s, and sequelae if not treated. 3.  Bicillin 2.4 mu IM given in both R and L GM today. 4.  Patient scheduled to RTC for tx #2 on 10/28/2020 at 2 pm and for tx #3 on 11/04/2020 at 2:20pm. 5.  No sex until after treatment has been completed. 6.  Rec condoms with all sex. 7.  Counseled patient re:  SE of injections and when to seek care.  Counseled that she can use OTC analgesics prn for pain, chills body aches. 8.  Patient walked out of clinic after receiving injections and stated that she could not wait due to other scheduled event.

## 2020-10-26 NOTE — Telephone Encounter (Signed)
Patient made aware and has had appt for redraw on 10/21/20 Richmond Campbell, RN

## 2020-10-28 ENCOUNTER — Ambulatory Visit: Payer: Medicaid Other

## 2020-10-28 ENCOUNTER — Other Ambulatory Visit: Payer: Self-pay

## 2020-10-28 DIAGNOSIS — A539 Syphilis, unspecified: Secondary | ICD-10-CM

## 2020-10-28 DIAGNOSIS — Z113 Encounter for screening for infections with a predominantly sexual mode of transmission: Secondary | ICD-10-CM | POA: Diagnosis not present

## 2020-10-28 NOTE — Progress Notes (Signed)
Pt to RN clinic for 2nd set of Bicillin LA injections, tolerated 1st fine, has appt to return to clinic for 3rd set of injections. Bicillin administered per Sadie Haber, PA order dated 10/21/20.

## 2020-11-04 ENCOUNTER — Ambulatory Visit: Payer: Medicaid Other

## 2020-11-04 ENCOUNTER — Other Ambulatory Visit: Payer: Self-pay

## 2020-11-04 DIAGNOSIS — Z113 Encounter for screening for infections with a predominantly sexual mode of transmission: Secondary | ICD-10-CM

## 2020-11-04 DIAGNOSIS — A539 Syphilis, unspecified: Secondary | ICD-10-CM

## 2020-11-04 NOTE — Progress Notes (Signed)
3rd set of Bicillin LA injections administered per Sadie Haber, PA order dated 10/21/20.

## 2021-01-01 ENCOUNTER — Ambulatory Visit: Payer: Medicaid Other

## 2021-01-21 ENCOUNTER — Ambulatory Visit (LOCAL_COMMUNITY_HEALTH_CENTER): Payer: Medicaid Other | Admitting: Physician Assistant

## 2021-01-21 ENCOUNTER — Encounter: Payer: Self-pay | Admitting: Physician Assistant

## 2021-01-21 ENCOUNTER — Other Ambulatory Visit: Payer: Self-pay

## 2021-01-21 VITALS — BP 116/64 | Ht 65.0 in | Wt 215.4 lb

## 2021-01-21 DIAGNOSIS — Z01419 Encounter for gynecological examination (general) (routine) without abnormal findings: Secondary | ICD-10-CM | POA: Diagnosis not present

## 2021-01-21 DIAGNOSIS — Z308 Encounter for other contraceptive management: Secondary | ICD-10-CM

## 2021-01-21 DIAGNOSIS — Z113 Encounter for screening for infections with a predominantly sexual mode of transmission: Secondary | ICD-10-CM

## 2021-01-21 DIAGNOSIS — Z30013 Encounter for initial prescription of injectable contraceptive: Secondary | ICD-10-CM | POA: Diagnosis not present

## 2021-01-21 DIAGNOSIS — Z3009 Encounter for other general counseling and advice on contraception: Secondary | ICD-10-CM | POA: Diagnosis not present

## 2021-01-21 DIAGNOSIS — A539 Syphilis, unspecified: Secondary | ICD-10-CM

## 2021-01-21 LAB — WET PREP FOR TRICH, YEAST, CLUE
Trichomonas Exam: NEGATIVE
Yeast Exam: NEGATIVE

## 2021-01-21 NOTE — Progress Notes (Signed)
[facility] Department STI clinic/screening visit  Subjective:  Misty Bradshaw is a 35 y.o. female being seen today for an STI screening visit. The patient reports they do have symptoms.  Patient reports that they do not desire a pregnancy in the next year.   They reported they are interested in discussing contraception today.  No LMP recorded.   Patient has the following medical conditions:   Patient Active Problem List   Diagnosis Date Noted  . 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 Migraines 10/09/2020  . Substance induced mood disorder (HCC) 09/11/2015  . Alcohol abuse 09/11/2015  . Bipolar disorder, current episode hypomanic (HCC) 09/04/2015  . Cannabis abuse 09/04/2015  . Homelessness 09/04/2015  . Low HDL (under 40) 07/23/2013  . Epidermoid cyst 07/22/2013  . Keratosis punctata 07/22/2013  . Enlarged thyroid gland 07/17/2013  . Hirsutism 07/17/2013  . Seasonal allergies 07/17/2013  . Secondary amenorrhea 07/17/2013  . Tobacco use disorder 07/08/2013  . Psychotic disorder (HCC) 07/04/2013    Chief Complaint  Patient presents with  . Contraception    Depo (in hip)  . screening sexually trasmitted disease    Screening for STD including bloodwork    Patient here for eval of vaginal sx and DMPA. Also interested in retesting for syphilis. Completed tx for prob late latent syphilis 11/04/20, no known new exposures. Vaginal sx include discharge and odor within last month. Amenorrheic on DMPA, last injection about 15 weeks ago.   Last HIV test per patient/review of record was 10/16/20. Patient reports last pap was 12/16/2017 (normal).   See flowsheet for further details and programmatic requirements.    The following portions of the patient's history were reviewed and updated as appropriate: allergies, current medications, past medical history, past social history, past surgical history and problem list.  Objective:   Vitals:   01/21/21 1514  BP:  116/64  Weight: 215 lb 6.4 oz (97.7 kg)  Height: 5\' 5"  (1.651 m)    Physical Exam Vitals and nursing note reviewed.  Constitutional:      Appearance: Normal appearance. She is obese.  HENT:     Head: Normocephalic and atraumatic.     Mouth/Throat:     Mouth: Mucous membranes are moist.     Pharynx: Oropharynx is clear. No oropharyngeal exudate or posterior oropharyngeal erythema.  Pulmonary:     Effort: Pulmonary effort is normal.  Chest:  Breasts:     Right: No axillary adenopathy or supraclavicular adenopathy.     Left: No axillary adenopathy or supraclavicular adenopathy.    Abdominal:     General: Abdomen is flat.     Palpations: There is no mass.     Tenderness: There is no abdominal tenderness. There is no rebound.  Genitourinary:    General: Normal vulva.     Exam position: Lithotomy position.     Pubic Area: No rash or pubic lice.      Labia:        Right: No rash or lesion.        Left: No rash or lesion.      Vagina: Vaginal discharge present. No erythema, bleeding or lesions.     Cervix: No cervical motion tenderness, discharge, friability, lesion or erythema.     Uterus: Normal.      Adnexa: Right adnexa normal and left adnexa normal.     Rectum: Normal.     Comments: sm amt adherent malodorous vag discharge, pH > 4.5. Lymphadenopathy:  Head:     Right side of head: No preauricular or posterior auricular adenopathy.     Left side of head: No preauricular or posterior auricular adenopathy.     Cervical: No cervical adenopathy.     Upper Body:     Right upper body: No supraclavicular or axillary adenopathy.     Left upper body: No supraclavicular or axillary adenopathy.     Lower Body: No right inguinal adenopathy. No left inguinal adenopathy.  Skin:    General: Skin is warm and dry.     Findings: No rash.  Neurological:     Mental Status: She is alert and oriented to person, place, and time.      Assessment and Plan:  Misty Bradshaw is a 35 y.o.  female presenting to the [facility] Department for STI screening  1. Routine screening for STI (sexually transmitted infection) Wet prep WNL, await GC/Chlam results. No treatment indicated at present. - WET PREP FOR TRICH, YEAST, CLUE - Chlamydia/Gonorrhea [place] [facility]  2. Encounter for other contraceptive management DMPA per prior med order.  3. 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    Return for 04/08/21 for depo and 07/21/21 for STD testing.  No future appointments.  Landry Dyke, PA-C

## 2021-01-21 NOTE — Progress Notes (Signed)
Wet mount reviewed by provider, no tx per provider orders. Other TR pending, pt counseled. DMPA 150 mg IM administered. Provider orders completed.

## 2021-01-21 NOTE — Progress Notes (Signed)
Pt to clinic for STD screening and depo. Pt states having vaginal symptoms.

## 2021-04-22 ENCOUNTER — Ambulatory Visit: Payer: Medicaid Other

## 2021-04-22 ENCOUNTER — Other Ambulatory Visit: Payer: Self-pay

## 2021-09-15 ENCOUNTER — Ambulatory Visit (LOCAL_COMMUNITY_HEALTH_CENTER): Payer: Medicaid Other

## 2021-09-15 ENCOUNTER — Other Ambulatory Visit: Payer: Self-pay

## 2021-09-15 VITALS — BP 118/81 | HR 73 | Ht 65.0 in | Wt 231.0 lb

## 2021-09-15 DIAGNOSIS — Z3202 Encounter for pregnancy test, result negative: Secondary | ICD-10-CM

## 2021-09-15 LAB — PREGNANCY, URINE: Preg Test, Ur: NEGATIVE

## 2021-09-21 ENCOUNTER — Ambulatory Visit: Payer: Medicaid Other

## 2021-09-28 ENCOUNTER — Ambulatory Visit: Payer: Medicaid Other

## 2022-01-03 ENCOUNTER — Other Ambulatory Visit: Payer: Self-pay

## 2022-01-03 ENCOUNTER — Ambulatory Visit (LOCAL_COMMUNITY_HEALTH_CENTER): Payer: Medicaid Other

## 2022-01-03 VITALS — BP 114/80 | Ht 66.0 in | Wt 216.5 lb

## 2022-01-03 DIAGNOSIS — Z3201 Encounter for pregnancy test, result positive: Secondary | ICD-10-CM

## 2022-01-03 LAB — PREGNANCY, URINE: Preg Test, Ur: POSITIVE — AB

## 2022-01-03 MED ORDER — PRENATAL 27-0.8 MG PO TABS: 1.0000 | ORAL_TABLET | Freq: Every day | ORAL | 0 refills | Status: AC

## 2022-01-03 NOTE — Progress Notes (Signed)
UPT positive. Plans prenatal care at [facility].  Last depo shot 01/21/2021 and no menses since. Had negative UPT on 09/15/2021 at [facility]. Became sexually active 10/24/2021. Unable to date pregnancy from LMP since she did not have one.  Will need exam / ultrasound to date preg. Per A White, FNP ok to document on proof of preg form : using 11/07/2021 date (2 weeks past sex on 10/24/2021) to estimate EGA which is approx 8 weeks 1 day and [facility] 08/14/2022. To clerk for preadmit. Josie Saunders, RN

## 2022-01-17 ENCOUNTER — Encounter: Payer: Self-pay | Admitting: Advanced Practice Midwife

## 2022-01-17 ENCOUNTER — Ambulatory Visit: Payer: Medicaid Other | Admitting: Advanced Practice Midwife

## 2022-01-17 ENCOUNTER — Other Ambulatory Visit: Payer: Self-pay

## 2022-01-17 DIAGNOSIS — Z8759 Personal history of other complications of pregnancy, childbirth and the puerperium: Secondary | ICD-10-CM | POA: Insufficient documentation

## 2022-01-17 DIAGNOSIS — O99210 Obesity complicating pregnancy, unspecified trimester: Secondary | ICD-10-CM | POA: Diagnosis not present

## 2022-01-17 DIAGNOSIS — O0992 Supervision of high risk pregnancy, unspecified, second trimester: Secondary | ICD-10-CM

## 2022-01-17 DIAGNOSIS — F1010 Alcohol abuse, uncomplicated: Secondary | ICD-10-CM

## 2022-01-17 DIAGNOSIS — E049 Nontoxic goiter, unspecified: Secondary | ICD-10-CM

## 2022-01-17 DIAGNOSIS — O0990 Supervision of high risk pregnancy, unspecified, unspecified trimester: Secondary | ICD-10-CM | POA: Diagnosis not present

## 2022-01-17 DIAGNOSIS — Z98891 History of uterine scar from previous surgery: Secondary | ICD-10-CM

## 2022-01-17 DIAGNOSIS — Z23 Encounter for immunization: Secondary | ICD-10-CM

## 2022-01-17 DIAGNOSIS — O365911 Maternal care for other known or suspected poor fetal growth, first trimester, fetus 1: Secondary | ICD-10-CM

## 2022-01-17 DIAGNOSIS — F1210 Cannabis abuse, uncomplicated: Secondary | ICD-10-CM

## 2022-01-17 DIAGNOSIS — Z8279 Family history of other congenital malformations, deformations and chromosomal abnormalities: Secondary | ICD-10-CM

## 2022-01-17 DIAGNOSIS — Z62810 Personal history of physical and sexual abuse in childhood: Secondary | ICD-10-CM | POA: Insufficient documentation

## 2022-01-17 LAB — URINALYSIS
Bilirubin, UA: NEGATIVE
Glucose, UA: NEGATIVE
Ketones, UA: NEGATIVE
Leukocytes,UA: NEGATIVE
Nitrite, UA: NEGATIVE
RBC, UA: NEGATIVE
Specific Gravity, UA: 1.02 (ref 1.005–1.030)
Urobilinogen, Ur: 0.2 mg/dL (ref 0.2–1.0)
pH, UA: 6.5 (ref 5.0–7.5)

## 2022-01-17 LAB — WET PREP FOR TRICH, YEAST, CLUE
Trichomonas Exam: NEGATIVE
Yeast Exam: NEGATIVE

## 2022-01-17 LAB — HEMOGLOBIN, FINGERSTICK: Hemoglobin: 13 g/dL (ref 11.1–15.9)

## 2022-01-17 NOTE — Progress Notes (Signed)
[place] Department  [facility] Clinic   INITIAL PRENATAL VISIT NOTE  Subjective:  Misty Bradshaw is a 36 y.o. seperated BF smoker G3P1102 (14, 5) being seen today to start prenatal care at the [facility] Department.  She feels "good, happy" about surprise pregnancy with no birth control. 36 yo unemployed FOB feels "he don't care"; together in off and on relationship for 16 years and he is the father of her 53 yo daughter; he will not be supportive; he has 6 kids who do not live with him. She is seperated from husband (married in 2009) and he is not the father of this baby. She is unsure of paternity and wants to know who the father is but LMP unknown and she had sex with the father of her 57 yo for the first time on 10/22/21. 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 Living with her 2 daughters Denies u/s this pregnancy or ER use.  Last pap 12/15/2017 neg HPV neg. Had pap at [facility] 02/28/17 neg HPV neg. Pap 12/2015 [facility] HPV +.  Last cig this am 1/2 ppd. Last cigar 2005. Last MJ 5 years ago. Last ETOH 12/31/21 (1 beer) q weekend. Denies vaping. Hx c/s x2 with pp hemorrhage x2. Hx preterm delivery at 35 wks with IOL for IUGR with fetal distress 3#14.  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 aunt on their problem list.  Patient reports no complaints.   .  .   . Denies leaking of fluid.   Indications for ASA therapy (per uptodate) One of the following: Previous pregnancy with preeclampsia, especially early onset and with an adverse outcome No Multifetal gestation No Chronic hypertension No Type 1 or 2 diabetes mellitus No Chronic kidney disease No Autoimmune disease (antiphospholipid syndrome, systemic lupus erythematosus) No  Two or more of the following: Nulliparity No Obesity (body mass index >30 kg/m2) Yes Family history of preeclampsia in mother or sister No Age ?35 years No Sociodemographic characteristics (African American race, low socioeconomic level) Yes Personal risk factors (eg, previous pregnancy with low birth weight or small for gestational age infant, previous adverse pregnancy outcome [eg, stillbirth], interval >10 years between pregnancies) Yes   The following portions of the patient's history were reviewed and updated as appropriate: allergies, current medications, past family history, past medical history, past social history, past surgical history and problem list. Problem list updated.  Objective:   Vitals:   01/17/22 1311  BP: 119/75  Pulse: 85  Temp: 97.9 F (36.6 C)  Weight: 217 lb 6.4 oz (98.6 kg)    Fetal Status:            Physical Exam Vitals and nursing note reviewed.  Constitutional:      General: She is not in acute distress.    Appearance: Normal appearance. She is well-developed.  HENT:     Head: Normocephalic and atraumatic.  Right Ear: External ear normal.     Left Ear: External ear normal.     Nose: Nose normal. No congestion or rhinorrhea.     Mouth/Throat:     Lips: Pink.     Mouth: Mucous membranes are moist.     Dentition: Normal dentition. No dental caries.     Pharynx: Oropharynx is clear. Uvula midline.     Comments: Dentition: fair Eyes:     General: No scleral icterus.     Conjunctiva/sclera: Conjunctivae normal.  Neck:     Thyroid: Thyromegaly (enlarged thyroid (also noted 2018)) present. No thyroid mass or thyroid tenderness.  Cardiovascular:     Rate and Rhythm: Normal rate.     Pulses: Normal pulses.     Comments: Extremities are warm and well perfused Pulmonary:     Effort: Pulmonary effort is normal.     Breath sounds: Normal breath sounds.  Chest:     Chest wall: No mass.  Breasts:    Tanner Score is 5.     Breasts are symmetrical.     Right: Normal. No mass, nipple discharge or skin change.     Left: Normal. No mass, nipple discharge or skin change.  Abdominal:     General: Abdomen is flat.     Palpations: Abdomen is soft.     Tenderness: There is no abdominal tenderness.     Comments: Gravid, soft, poor tone, without tenderness, FHR=160, difficult to assess fundal height due to increased adipose  Genitourinary:    General: Normal vulva.     Exam position: Lithotomy position.     Pubic Area: No rash.      Labia:        Right: No rash.        Left: No rash.      Vagina: Vaginal discharge (frothy sl malodorous leukorrhea, ph>4.5) present.     Cervix: Normal.     Uterus: Normal. Enlarged (Gravid approximately 12 wks size). Not tender.      Rectum: Normal. No external hemorrhoid.  Musculoskeletal:     Right lower leg: No edema.     Left lower leg: No edema.  Lymphadenopathy:     Cervical: No cervical adenopathy.     Upper Body:     Right upper body: No axillary adenopathy.     Left upper body: No axillary adenopathy.  Skin:    General: Skin is warm.     Capillary Refill: Capillary refill takes less than 2 seconds.  Neurological:     Mental Status: She is alert.    Assessment and Plan:  Pregnancy: [id number] at Unknown  1. Supervision of high risk pregnancy in second trimester Needs early glucola today - Glucose, 1 hour gestational - Hgb A1c w/o eAG - HIV-1/HIV-2 Qualitative RNA - Comprehensive metabolic panel - Prenatal profile  without Varicella/Rubella [id number]) - Protein / creatinine ratio, urine  (Spot) - TSH - Urine Culture - Chlamydia/GC NAA, Confirmation - HCV Ab w Reflex to Quant PCR [id number] Drug Screen - WET PREP FOR TRICH, YEAST, CLUE - Urinalysis (Urine Dip) - Hemoglobin, venipuncture - T4, free - T3  2. Preterm delivery 35 wks Due to IUGR and IOL  3. Alcohol abuse; last use 12/31/21 (1 beer) Counseled not to drink in pregnancy; pt declines need for ETOH cessation assistance  4. Hx IUGR first preganancy with IOL Monitor closely  5. Cannabis abuse States last use 5 years ago  6. Obesity affecting pregnancy, antepartum Agrees to ASA  81 mg daily Counseled on weight gain of 11-20 lbs this pregnancy  7. History of C-section x2 Will need repeat c/s 8. Family history of Downs syndrome p. aunt Declines genetic counseling Desires [place] screen  9. Enlarged thyroid gland 2018 TSH, T3, free T4 today This was also noted in 2008 and 2018 with no f/u by pt    Discussed overview of care and coordination with inpatient delivery practices including [facility], [facility], [facility] and [facility].   Reviewed Centering pregnancy as standard of care at [facility]   Preterm labor symptoms and general obstetric precautions including but not limited to vaginal bleeding, contractions, leaking of fluid and fetal movement were reviewed in detail with the patient.  Please refer to After Visit Summary for other counseling recommendations.   No follow-ups on file.  No future appointments.  Herbie Saxon, CNM

## 2022-01-17 NOTE — Progress Notes (Signed)
Here today for MH IP appt. Unsure LMP. Taking PNV QD. Denies ED/hospital visits since +PT. 1hgtt and Flu vaccine given. Tawny Hopping, RN

## 2022-01-17 NOTE — Progress Notes (Signed)
Hgb, wet mount and UA reviewed while in clinic. No treatment indicated. Tawny Hopping, RN

## 2022-01-18 LAB — T4, FREE: Free T4: 0.88 ng/dL (ref 0.82–1.77)

## 2022-01-18 LAB — HIV-1/HIV-2 QUALITATIVE RNA
HIV-1 RNA, Qualitative: NONREACTIVE
HIV-2 RNA, Qualitative: NONREACTIVE

## 2022-01-18 LAB — T3: T3, Total: 204 ng/dL — ABNORMAL HIGH (ref 71–180)

## 2022-01-19 LAB — CBC/D/PLT+RPR+RH+ABO+AB SCR
Antibody Screen: NEGATIVE
Basophils Absolute: 0 10*3/uL (ref 0.0–0.2)
Basos: 0 %
EOS (ABSOLUTE): 0.2 10*3/uL (ref 0.0–0.4)
Eos: 2 %
Hematocrit: 37.3 % (ref 34.0–46.6)
Hemoglobin: 12.8 g/dL (ref 11.1–15.9)
Hepatitis B Surface Ag: NEGATIVE
Immature Grans (Abs): 0.1 10*3/uL (ref 0.0–0.1)
Immature Granulocytes: 1 %
Lymphocytes Absolute: 2.6 10*3/uL (ref 0.7–3.1)
Lymphs: 22 %
MCH: 32.1 pg (ref 26.6–33.0)
MCHC: 34.3 g/dL (ref 31.5–35.7)
MCV: 94 fL (ref 79–97)
Monocytes Absolute: 0.9 10*3/uL (ref 0.1–0.9)
Monocytes: 7 %
Neutrophils Absolute: 8.1 10*3/uL — ABNORMAL HIGH (ref 1.4–7.0)
Neutrophils: 68 %
Platelets: 273 10*3/uL (ref 150–450)
RBC: 3.99 x10E6/uL (ref 3.77–5.28)
RDW: 12.2 % (ref 11.7–15.4)
RPR Ser Ql: REACTIVE — AB
Rh Factor: POSITIVE
WBC: 11.9 10*3/uL — ABNORMAL HIGH (ref 3.4–10.8)

## 2022-01-19 LAB — COMPREHENSIVE METABOLIC PANEL
ALT: 18 IU/L (ref 0–32)
AST: 16 IU/L (ref 0–40)
Albumin/Globulin Ratio: 1.7 (ref 1.2–2.2)
Albumin: 4.4 g/dL (ref 3.8–4.8)
Alkaline Phosphatase: 68 IU/L (ref 44–121)
BUN/Creatinine Ratio: 10 (ref 9–23)
BUN: 6 mg/dL (ref 6–20)
Bilirubin Total: <0.2 mg/dL (ref 0.0–1.2)
CO2: 23 mmol/L (ref 20–29)
Calcium: 8.9 mg/dL (ref 8.7–10.2)
Chloride: 102 mmol/L (ref 96–106)
Creatinine, Ser: 0.63 mg/dL (ref 0.57–1.00)
Globulin, Total: 2.6 g/dL (ref 1.5–4.5)
Glucose: 83 mg/dL (ref 70–99)
Potassium: 4.1 mmol/L (ref 3.5–5.2)
Sodium: 138 mmol/L (ref 134–144)
Total Protein: 7 g/dL (ref 6.0–8.5)
eGFR: 119 mL/min/{1.73_m2} (ref >59)

## 2022-01-19 LAB — CHLAMYDIA/GC NAA, CONFIRMATION
Chlamydia trachomatis, NAA: NEGATIVE
Neisseria gonorrhoeae, NAA: NEGATIVE

## 2022-01-19 LAB — RPR, QUANT+TP ABS (REFLEX)
Rapid Plasma Reagin, Quant: 1:2 {titer} — ABNORMAL HIGH
T Pallidum Abs: REACTIVE — AB

## 2022-01-19 LAB — URINE CULTURE

## 2022-01-19 LAB — TSH: TSH: 0.368 u[IU]/mL — ABNORMAL LOW (ref 0.450–4.500)

## 2022-01-19 LAB — HCV AB W REFLEX TO QUANT PCR: HCV Ab: NONREACTIVE

## 2022-01-19 LAB — GLUCOSE, 1 HOUR GESTATIONAL: Gestational Diabetes Screen: 82 mg/dL (ref 70–139)

## 2022-01-19 LAB — HGB A1C W/O EAG: Hgb A1c MFr Bld: 5.5 % (ref 4.8–5.6)

## 2022-01-19 LAB — HCV INTERPRETATION

## 2022-01-20 NOTE — Progress Notes (Signed)
[facility] MFM consult faxed with snapshot and records. Confirmation received.Burt Knack, RN

## 2022-01-24 ENCOUNTER — Encounter: Payer: Self-pay | Admitting: Advanced Practice Midwife

## 2022-01-24 DIAGNOSIS — R825 Elevated urine levels of drugs, medicaments and biological substances: Secondary | ICD-10-CM | POA: Insufficient documentation

## 2022-01-24 LAB — 789231 7+OXYCODONE-BUND
Amphetamines, Urine: NEGATIVE ng/mL
BENZODIAZ UR QL: NEGATIVE ng/mL
Barbiturate screen, urine: NEGATIVE ng/mL
OPIATE SCREEN URINE: NEGATIVE ng/mL
Oxycodone/Oxymorphone, Urine: NEGATIVE ng/mL
PCP Quant, Ur: NEGATIVE ng/mL

## 2022-01-24 LAB — PROTEIN / CREATININE RATIO, URINE
Creatinine, Urine: 204.4 mg/dL
Protein, Ur: 17.4 mg/dL
Protein/Creat Ratio: 85 mg/g creat (ref 0–200)

## 2022-01-24 LAB — CANNABINOID CONFIRMATION, UR
CANNABINOIDS: POSITIVE — AB
Carboxy THC GC/MS Conf: 607 ng/mL

## 2022-01-24 LAB — COCAINE CONF, UR
Benzoylecgonine GC/MS Conf: 6120 ng/mL
Cocaine Metab Quant, Ur: POSITIVE — AB

## 2022-02-01 ENCOUNTER — Telehealth: Payer: Self-pay | Admitting: Advanced Practice Midwife

## 2022-02-01 ENCOUNTER — Telehealth: Payer: Self-pay

## 2022-02-01 NOTE — Telephone Encounter (Signed)
T/c with pt to discuss her thyroid and goiter since 2008, not following up on referrals/pp referrals, recent abnormal thyroid blood tests, current pregnancy, hx IUGR in prior pregnancy, etc. [facility] said they called pt to schedule an apt with MFM for abnormal thyroid and pt refused apt. Strongly encouraged pt to accept this apt as thyroid hormones affect every organ in her body as well as the fetus. Pt states she would now like to have an apt to have this managed by MFM. To ask RN to reschedule apt with [facility] MFM ?

## 2022-02-01 NOTE — Telephone Encounter (Signed)
Spoke with patient has she has declined [facility] services to evaluate thyroid. According to Arnetha Courser, CNM, she spoke to patient and discussed this and per provider patient does now desire to see [facility] MFM for evaluation for thyroid. Called patient to give her [facility] MFM number [phone] to give them a call and make her an appointment.  ? ?Floy Sabina, RN ? ?

## 2022-02-03 ENCOUNTER — Telehealth: Payer: Self-pay

## 2022-02-03 NOTE — Telephone Encounter (Signed)
Per [employer], client declined an in-person appt for MFM consult regarding ? hyperthyroidism. Per [facility], they have made 3 unsuccessful phone attempts to schedule a telephone MFM consult appt with client. Call to client to request she call and schedule MFM consult appt. Client states she has the phone number to call and correctly verbalized number to RN. Jossie Ng, RN ? ?

## 2022-02-07 NOTE — Progress Notes (Signed)
Per [facility], patient refused hyperthyroidism appointment. Two RN's have called patient with number to [facility] MFM clinic with counseling to call for an appointment and it's importance. No appointment scheduled as of 02/07/2022.Marland KitchenJenetta Downer, RN  ?

## 2022-02-14 ENCOUNTER — Ambulatory Visit: Payer: Medicaid Other

## 2022-02-14 ENCOUNTER — Other Ambulatory Visit: Payer: Self-pay

## 2022-02-16 ENCOUNTER — Encounter: Payer: Self-pay | Admitting: Nurse Practitioner

## 2022-02-16 ENCOUNTER — Ambulatory Visit: Payer: Medicaid Other | Admitting: Nurse Practitioner

## 2022-02-16 ENCOUNTER — Other Ambulatory Visit: Payer: Self-pay

## 2022-02-16 VITALS — BP 113/75 | HR 85 | Temp 97.0°F | Wt 217.8 lb

## 2022-02-16 DIAGNOSIS — O99212 Obesity complicating pregnancy, second trimester: Secondary | ICD-10-CM | POA: Diagnosis not present

## 2022-02-16 DIAGNOSIS — E049 Nontoxic goiter, unspecified: Secondary | ICD-10-CM

## 2022-02-16 DIAGNOSIS — F17200 Nicotine dependence, unspecified, uncomplicated: Secondary | ICD-10-CM | POA: Diagnosis not present

## 2022-02-16 DIAGNOSIS — O0992 Supervision of high risk pregnancy, unspecified, second trimester: Secondary | ICD-10-CM | POA: Diagnosis not present

## 2022-02-16 DIAGNOSIS — R825 Elevated urine levels of drugs, medicaments and biological substances: Secondary | ICD-10-CM

## 2022-02-16 DIAGNOSIS — O99210 Obesity complicating pregnancy, unspecified trimester: Secondary | ICD-10-CM

## 2022-02-16 DIAGNOSIS — F310 Bipolar disorder, current episode hypomanic: Secondary | ICD-10-CM

## 2022-02-16 DIAGNOSIS — F1010 Alcohol abuse, uncomplicated: Secondary | ICD-10-CM
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
## 2022-02-16 NOTE — Progress Notes (Addendum)
Here today for 18.4 week MH RV. Taking PNV QD. Denies ED/hospital visits since last RV. Declines Quad Screen today. Declination form signed. Aware of and plans to keep [facility] scheduled [place] and MFM appts for 02/28/22 and 03/01/22. Tawny Hopping, RN ? ?

## 2022-03-10 ENCOUNTER — Encounter: Payer: Self-pay | Admitting: Advanced Practice Midwife

## 2022-03-14 ENCOUNTER — Telehealth: Payer: Self-pay | Admitting: Family Medicine

## 2022-03-14 NOTE — Telephone Encounter (Signed)
LVM for patient to call and schedule her MH FU appointment due 03/16/22 ?

## 2022-03-16 NOTE — Telephone Encounter (Signed)
Telephone call to patient this morning regarding her MHC RV due 03-16-2022.  Patient will call back [phone] to schedule appointment. Dahlia Bailiff, RN ? ?

## 2022-03-17 NOTE — Telephone Encounter (Signed)
Per [facility] Epic appt desk, client has scheduled MHC RV appt for 03/24/2022. Jossie Ng, RN ? ?

## 2022-03-24 ENCOUNTER — Ambulatory Visit: Payer: Medicaid Other

## 2022-03-24 ENCOUNTER — Telehealth: Payer: Self-pay

## 2022-03-24 NOTE — Telephone Encounter (Signed)
Call to client as [facility] as scheduled for [facility] RV this pm. Per client, something came up and unable to keep appt. Appt rescheduled for 03/30/2022. Jossie Ng, RN ? ?

## 2022-03-30 ENCOUNTER — Ambulatory Visit: Payer: Medicaid Other | Admitting: Family Medicine

## 2022-03-30 VITALS — BP 104/64 | HR 73 | Temp 97.3°F | Wt 225.2 lb

## 2022-03-30 DIAGNOSIS — F17200 Nicotine dependence, unspecified, uncomplicated: Secondary | ICD-10-CM

## 2022-03-30 DIAGNOSIS — O99212 Obesity complicating pregnancy, second trimester: Secondary | ICD-10-CM

## 2022-03-30 DIAGNOSIS — O0992 Supervision of high risk pregnancy, unspecified, second trimester: Secondary | ICD-10-CM | POA: Diagnosis not present

## 2022-03-30 DIAGNOSIS — E049 Nontoxic goiter, unspecified: Secondary | ICD-10-CM

## 2022-03-30 DIAGNOSIS — O99210 Obesity complicating pregnancy, unspecified trimester: Secondary | ICD-10-CM

## 2022-03-30 DIAGNOSIS — R825 Elevated urine levels of drugs, medicaments and biological substances: Secondary | ICD-10-CM

## 2022-03-30 NOTE — Progress Notes (Signed)
Here today for 23.6 week MH RV. Taking PNV QD. Denies ED/hospital visits since last RV. Kept 03/01/22 [facility] Thyroid consult appt. Rescheduled [facility] [place] for 04/13/22 @ 3:00. PTL s/s info given today. Tawny Hopping, RN ? ?
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
## 2022-08-03 ENCOUNTER — Ambulatory Visit: Payer: Medicaid Other

## 2022-08-16 ENCOUNTER — Telehealth: Payer: Self-pay | Admitting: Family Medicine

## 2022-08-16 NOTE — Telephone Encounter (Signed)
Called patient to re-schedule her pp appointment. After several rings, someone picked up the phone and it sounded as if they put the phone in front of a speaker with loud music. I stated who I was calling for and where I was calling from and got no response.

## 2022-08-17 NOTE — Telephone Encounter (Signed)
Call to client and immediately heard loud music sounds then recorded message states voicemail box is full. Call to mother (emergency contact) and left message requesting assistance contacting client to call us for post-partum appt. Rich Number, RN

## 2022-08-18 NOTE — Telephone Encounter (Signed)
Per Epic, patient has rescheduled her appointment for 09/01/22 at 3 pm.  Dahlia Bailiff, RN

## 2022-09-01 ENCOUNTER — Ambulatory Visit: Payer: Medicaid Other

## 2022-09-15 ENCOUNTER — Ambulatory Visit: Payer: Medicaid Other

## 2022-10-25 ENCOUNTER — Encounter: Payer: Self-pay | Admitting: Emergency Medicine

## 2022-10-25 ENCOUNTER — Other Ambulatory Visit: Payer: Self-pay

## 2022-10-25 ENCOUNTER — Emergency Department
Admission: EM | Admit: 2022-10-25 | Discharge: 2022-10-25 | Disposition: A | Discharge status: Home / Self Care | Payer: Medicaid Other | Attending: Emergency Medicine | Admitting: Emergency Medicine

## 2022-10-25 DIAGNOSIS — J029 Acute pharyngitis, unspecified: Secondary | ICD-10-CM | POA: Diagnosis present

## 2022-10-25 DIAGNOSIS — R059 Cough, unspecified: Secondary | ICD-10-CM | POA: Diagnosis not present

## 2022-10-25 DIAGNOSIS — Z1152 Encounter for screening for COVID-19: Secondary | ICD-10-CM | POA: Insufficient documentation

## 2022-10-25 LAB — GROUP A STREP BY PCR: Group A Strep by PCR: NOT DETECTED

## 2022-10-25 LAB — RESP PANEL BY RT-PCR (FLU A&B, COVID) ARPGX2
Influenza A by PCR: NEGATIVE
Influenza B by PCR: NEGATIVE
SARS Coronavirus 2 by RT PCR: NEGATIVE

## 2022-10-25 NOTE — ED Triage Notes (Signed)
Presents with sore throat

## 2022-10-25 NOTE — ED Provider Notes (Signed)
   [facility] Provider Note    Event Date/Time   First MD Initiated Contact with Patient 10/25/22 1326     (approximate)  History   Chief Complaint: Sore Throat  HPI  Lane Kjos is a 36 y.o. female with a past medical history of anxiety, bipolar, presents to the emergency department for sore throat.  According to the patient approximately 10 to 14 days ago patient had pinkeye she states with eye itching and discharge as well as a slight cough.  States those symptoms have resolved but over the past 5 or more days patient has developed a sore throat.  Patient has been using over-the-counter medications but states that the sore throat does not appear to be improving so she came to the emergency department for evaluation.  Patient denies any recent fever.  States her other symptoms of cough/congestion have largely resolved.  Physical Exam   Triage Vital Signs: ED Triage Vitals  Enc Vitals Group     BP 10/25/22 1205 (!) 156/81     Pulse Rate 10/25/22 1205 73     Resp 10/25/22 1205 18     Temp 10/25/22 1205 98.2 F (36.8 C)     Temp Source 10/25/22 1205 Oral     SpO2 10/25/22 1205 97 %     Weight 10/25/22 1138 225 lb 5 oz (102.2 kg)     Height 10/25/22 1138 5\' 6"  (1.676 m)     Head Circumference --      Peak Flow --      Pain Score 10/25/22 1137 9     Pain Loc --      Pain Edu? --      Excl. in GC? --     Most recent vital signs: Vitals:   10/25/22 1205  BP: (!) 156/81  Pulse: 73  Resp: 18  Temp: 98.2 F (36.8 C)  SpO2: 97%    General: Awake, no distress.  CV:  Good peripheral perfusion.  Regular rate and rhythm  Resp:  Normal effort.  Equal breath sounds bilaterally.  Abd:  No distention.  Soft, nontender.  No rebound or guarding. Other:  No pharyngeal erythema or exudate noted.  Widely patent oropharynx.   ED Results / Procedures / Treatments   MEDICATIONS ORDERED IN ED: Medications - No data to display   IMPRESSION / MDM /  ASSESSMENT AND PLAN / ED COURSE  I reviewed the triage vital signs and the nursing notes.  Patient's presentation is most consistent with acute presentation with potential threat to life or bodily function.  Patient presents emergency department for sore throat.  Overall the patient appears well, no distress, reassuring vitals.  Reassuring physical exam.  No pharyngeal erythema or exudate noted.  Suspect likely resolving viral infection given the patient's symptoms of injection discharge cough congestion that preceded her sore throat symptoms.  Patient strep test is negative.  COVID/flu test is negative as well.  Given the patient's reassuring workup and reassuring exam I believe the patient is safe for discharge home with outpatient/PCP follow-up.  Discussed supportive care at home.  FINAL CLINICAL IMPRESSION(S) / ED DIAGNOSES   Pharyngitis   Note:  This document was prepared using Dragon voice recognition software and may include unintentional dictation errors.   10/27/22, MD 10/25/22 1335

## 2022-10-25 NOTE — ED Provider Triage Note (Signed)
Emergency Medicine Provider Triage Evaluation Note  Misty Bradshaw , a 36 y.o. female  was evaluated in triage.  Pt complains of sore throat, recent uri and pink eye treated with antibiotics.  Review of Systems  Positive:  Negative:   Physical Exam  BP (!) 156/81 (BP Location: Left Arm)   Pulse 73   Temp 98.2 F (36.8 C) (Oral)   Resp 18   Ht 5\' 6"  (1.676 m)   Wt 102.2 kg   SpO2 97%   BMI 36.37 kg/m  Gen:   Awake, no distress   Resp:  Normal effort  MSK:   Moves extremities without difficulty  Other:    Medical Decision Making  Medically screening exam initiated at 12:07 PM.  Appropriate orders placed.  Misty Bradshaw was informed that the remainder of the evaluation will be completed by another provider, this initial triage assessment does not replace that evaluation, and the importance of remaining in the ED until their evaluation is complete.     Tora Kindred, PA-C 10/25/22 1207

## 2022-11-11 ENCOUNTER — Ambulatory Visit: Payer: Medicaid Other

## 2024-07-27 ENCOUNTER — Other Ambulatory Visit: Payer: Self-pay

## 2024-07-27 ENCOUNTER — Emergency Department
Admission: EM | Admit: 2024-07-27 | Discharge: 2024-07-27 | Disposition: A | Discharge status: Home / Self Care | Attending: Emergency Medicine | Admitting: Emergency Medicine

## 2024-07-27 DIAGNOSIS — K0401 Reversible pulpitis: Secondary | ICD-10-CM | POA: Insufficient documentation

## 2024-07-27 DIAGNOSIS — K0889 Other specified disorders of teeth and supporting structures: Secondary | ICD-10-CM

## 2024-07-27 DIAGNOSIS — K0381 Cracked tooth: Secondary | ICD-10-CM | POA: Insufficient documentation

## 2024-07-27 MED ORDER — AMOXICILLIN-POT CLAVULANATE 875-125 MG PO TABS: 1.0000 | ORAL_TABLET | Freq: Two times a day (BID) | ORAL | 0 refills | Status: AC

## 2024-07-27 MED ORDER — IBUPROFEN 600 MG PO TABS: 600.0000 mg | ORAL_TABLET | Freq: Four times a day (QID) | ORAL | 0 refills | Status: AC | PRN

## 2024-07-27 NOTE — ED Provider Notes (Signed)
 [facility] Provider Note    Event Date/Time   First MD Initiated Contact with Patient 07/27/24 1113     (approximate)   History   Dental Pain   HPI  Misty Bradshaw is a 38 y.o. female who is otherwise healthy comes in with right lower posterior dental pain over the past 3 months.  She does report having a dentist appointment on 9/23.  Patient reports that she broke the back molar tooth and that over the past 2 weeks she has been having increasing pain.  She reports taking Tylenol  to try to help with pain.  She denies any known fevers.  No history of dental abscesses.  No abdominal pain or other concerns.  She does report having a tubal ligation.   Physical Exam   Triage Vital Signs: ED Triage Vitals  Encounter Vitals Group     BP 07/27/24 1050 (!) 145/93     Girls Systolic BP Percentile --      Girls Diastolic BP Percentile --      Boys Systolic BP Percentile --      Boys Diastolic BP Percentile --      Pulse Rate 07/27/24 1050 92     Resp 07/27/24 1050 18     Temp 07/27/24 1050 97.8 F (36.6 C)     Temp Source 07/27/24 1050 Oral     SpO2 07/27/24 1050 98 %     Weight 07/27/24 1052 213 lb (96.6 kg)     Height 07/27/24 1052 5' 6 (1.676 m)     Head Circumference --      Peak Flow --      Pain Score 07/27/24 1051 8     Pain Loc --      Pain Education --      Exclude from Growth Chart --     Most recent vital signs: Vitals:   07/27/24 1050  BP: (!) 145/93  Pulse: 92  Resp: 18  Temp: 97.8 F (36.6 C)  SpO2: 98%     General: Awake, no distress.  CV:  Good peripheral perfusion.  Resp:  Normal effort.  Abd:  No distention.  Other:  Soft palate.  Uvula midline no pus noted.  TMs clear bilaterally.  Broken right lower molar tooth with tenderness with palpation on top of the tooth but no fluctuation or abscess felt near the tooth.  Full range of motion of neck.  Speaking in full sentences no drooling.   ED Results / Procedures /  Treatments   Labs (all labs ordered are listed, but only abnormal results are displayed) Labs Reviewed - No data to display    PROCEDURES:  Critical Care performed: No  Procedures   MEDICATIONS ORDERED IN ED: Medications - No data to display   IMPRESSION / MDM / ASSESSMENT AND PLAN / ED COURSE  I reviewed the triage vital signs and the nursing notes.   Patient's presentation is most consistent with acute, uncomplicated illness.   Patient comes in with tooth pain from a broken tooth.  Reviewed patient's blood work from 01/17/2022 where patient had normal creatinine.  She is not on any blood thinners.  We discussed ibuprofen , alternating with Tylenol  for pain.  We discussed doing some antibiotics to cover for possible pulpitis but no obvious evidence of abscess.  We discussed the importance of following up with a dentist and seeing if they can get an earlier.  No evidence of dental abscess, retropharyngeal abscess, peritonsillar abscess based  on examination recommended doing pregnancy test prior to initiating ibuprofen  but patient declines today and she has had a prior tubal ligation.      FINAL CLINICAL IMPRESSION(S) / ED DIAGNOSES   Final diagnoses:  Pulpitis  Pain, dental     Rx / DC Orders   ED Discharge Orders          Ordered    amoxicillin -clavulanate (AUGMENTIN ) 875-125 MG tablet  2 times daily        07/27/24 1126    ibuprofen  (ADVIL ) 600 MG tablet  Every 6 hours PRN        07/27/24 1126             Note:  This document was prepared using Dragon voice recognition software and may include unintentional dictation errors.   Ernest Ronal BRAVO, MD 07/27/24 [phone]

## 2024-07-27 NOTE — ED Triage Notes (Signed)
 Pt to ED for R lower posterior dental pain since 3 months. Dental appt on 9/23 coming up. No obvious swelling noted.

## 2024-07-27 NOTE — Discharge Instructions (Addendum)
 You can use Tylenol  1 g every 8 hours with a max of 3000 mg daily for 1 week.  You can alternate this with the ibuprofen  and you should eat food with the ibuprofen  to help prevent an upset stomach.  She can take antibiotics in case there is any infection that is starting that is increasing your pain.  However the most important part is to call your dentist to try to make an earlier follow-up appointment so they can evaluate the area.  Return to the ER for fevers, swelling or any other concern

## 2024-08-14 ENCOUNTER — Ambulatory Visit

## 2024-08-14 DIAGNOSIS — B3731 Acute candidiasis of vulva and vagina: Secondary | ICD-10-CM

## 2024-08-14 DIAGNOSIS — Z113 Encounter for screening for infections with a predominantly sexual mode of transmission: Secondary | ICD-10-CM | POA: Diagnosis not present

## 2024-08-14 LAB — HM HIV SCREENING LAB: HM HIV Screening: NEGATIVE

## 2024-08-14 LAB — WET PREP FOR TRICH, YEAST, CLUE
Clue Cell Exam: NEGATIVE
Trichomonas Exam: NEGATIVE
Yeast Exam: NEGATIVE

## 2024-08-14 MED ORDER — CLOTRIMAZOLE 1 % VA CREA: 1.0000 | TOPICAL_CREAM | Freq: Every day | VAGINAL | Status: AC

## 2024-08-14 MED ORDER — CLOTRIMAZOLE 1 % VA CREA: 1.0000 | TOPICAL_CREAM | Freq: Every day | VAGINAL | Status: DC
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
## 2024-08-14 NOTE — Progress Notes (Addendum)
 Pt is here for STD screening. Wet prep results reviewed with pt and pat was dispensed clotrimazole  1% vaginal cream once/day for days. I provided counseling today regarding the cream, the side effects and when to call clinic. Patient given the opportunity to ask questions for any clarifications. Questions answered and condoms declined. Wilkie Drought, RN.

## 2024-08-14 NOTE — Addendum Note (Signed)
 Addended by: ROSABEL PERKINS E on: 08/14/2024 02:22 PM   Modules accepted: Orders

## 2024-08-16 LAB — SYPHILIS SEROLOGY, ~~LOC~~ LAB
RPR, Quant: 1:2 {titer}
RPR: REACTIVE

## 2024-08-28 ENCOUNTER — Ambulatory Visit: Payer: Self-pay | Admitting: Family Medicine

## 2024-08-28 NOTE — Progress Notes (Signed)
 Wet prep reviewed in clinic.  RPR results with titer 1:2, known previous history of syphilis. Congruent result from 01/17/2022. Patient is at her baseline, no evidence of new infection.   Dorothyann Helling, MD 08/28/24  12:12 PM
